# Patient Record
Sex: Female | Born: 1960 | Race: Black or African American | Hispanic: No | Marital: Single | Smoking: Never smoker
Health system: Southern US, Community
[De-identification: ages and names within clinical notes are randomized; demographics above are authoritative.]

## PROBLEM LIST (undated history)

## (undated) DIAGNOSIS — I10 Essential (primary) hypertension: Secondary | ICD-10-CM

## (undated) DIAGNOSIS — T8859XA Other complications of anesthesia, initial encounter: Secondary | ICD-10-CM

## (undated) DIAGNOSIS — K219 Gastro-esophageal reflux disease without esophagitis: Secondary | ICD-10-CM

## (undated) DIAGNOSIS — J392 Other diseases of pharynx: Secondary | ICD-10-CM

## (undated) DIAGNOSIS — M1990 Unspecified osteoarthritis, unspecified site: Secondary | ICD-10-CM

## (undated) DIAGNOSIS — R040 Epistaxis: Secondary | ICD-10-CM

## (undated) DIAGNOSIS — E119 Type 2 diabetes mellitus without complications: Secondary | ICD-10-CM

## (undated) DIAGNOSIS — J309 Allergic rhinitis, unspecified: Secondary | ICD-10-CM

## (undated) DIAGNOSIS — M109 Gout, unspecified: Secondary | ICD-10-CM

## (undated) DIAGNOSIS — Z9889 Other specified postprocedural states: Secondary | ICD-10-CM

## (undated) DIAGNOSIS — T4145XA Adverse effect of unspecified anesthetic, initial encounter: Secondary | ICD-10-CM

## (undated) DIAGNOSIS — R112 Nausea with vomiting, unspecified: Secondary | ICD-10-CM

## (undated) HISTORY — DX: Essential (primary) hypertension: I10

## (undated) HISTORY — DX: Allergic rhinitis, unspecified: J30.9

## (undated) HISTORY — PX: ABDOMINAL HYSTERECTOMY: SHX81

## (undated) HISTORY — DX: Type 2 diabetes mellitus without complications: E11.9

## (undated) HISTORY — DX: Gout, unspecified: M10.9

## (undated) HISTORY — PX: SINOSCOPY: SHX187

## (undated) HISTORY — PX: CERVICAL BIOPSY: SHX590

---

## 2004-04-30 ENCOUNTER — Other Ambulatory Visit: Admission: RE | Admit: 2004-04-30 | Discharge: 2004-04-30 | Payer: Self-pay | Admitting: Obstetrics and Gynecology

## 2004-05-03 ENCOUNTER — Ambulatory Visit (HOSPITAL_COMMUNITY): Admission: RE | Admit: 2004-05-03 | Discharge: 2004-05-03 | Payer: Self-pay | Admitting: Obstetrics and Gynecology

## 2004-06-05 ENCOUNTER — Encounter (INDEPENDENT_AMBULATORY_CARE_PROVIDER_SITE_OTHER): Payer: Self-pay | Admitting: Specialist

## 2004-06-05 ENCOUNTER — Ambulatory Visit (HOSPITAL_COMMUNITY): Admission: RE | Admit: 2004-06-05 | Discharge: 2004-06-05 | Payer: Self-pay | Admitting: Obstetrics and Gynecology

## 2004-06-29 ENCOUNTER — Inpatient Hospital Stay (HOSPITAL_COMMUNITY): Admission: AD | Admit: 2004-06-29 | Discharge: 2004-06-29 | Payer: Self-pay | Admitting: Obstetrics and Gynecology

## 2004-07-08 ENCOUNTER — Encounter (INDEPENDENT_AMBULATORY_CARE_PROVIDER_SITE_OTHER): Payer: Self-pay | Admitting: Specialist

## 2004-07-08 ENCOUNTER — Inpatient Hospital Stay (HOSPITAL_COMMUNITY): Admission: RE | Admit: 2004-07-08 | Discharge: 2004-07-10 | Payer: Self-pay | Admitting: Obstetrics and Gynecology

## 2005-05-12 ENCOUNTER — Other Ambulatory Visit: Admission: RE | Admit: 2005-05-12 | Discharge: 2005-05-12 | Payer: Self-pay | Admitting: Obstetrics and Gynecology

## 2006-02-02 ENCOUNTER — Ambulatory Visit (HOSPITAL_COMMUNITY): Admission: RE | Admit: 2006-02-02 | Discharge: 2006-02-02 | Payer: Self-pay | Admitting: Obstetrics and Gynecology

## 2007-02-15 ENCOUNTER — Ambulatory Visit (HOSPITAL_COMMUNITY): Admission: RE | Admit: 2007-02-15 | Discharge: 2007-02-15 | Payer: Self-pay | Admitting: Obstetrics and Gynecology

## 2008-02-25 ENCOUNTER — Ambulatory Visit (HOSPITAL_COMMUNITY): Admission: RE | Admit: 2008-02-25 | Discharge: 2008-02-25 | Payer: Self-pay | Admitting: Obstetrics and Gynecology

## 2009-03-02 ENCOUNTER — Ambulatory Visit (HOSPITAL_COMMUNITY): Admission: RE | Admit: 2009-03-02 | Discharge: 2009-03-02 | Payer: Self-pay | Admitting: Obstetrics and Gynecology

## 2010-03-08 ENCOUNTER — Ambulatory Visit (HOSPITAL_COMMUNITY)
Admission: RE | Admit: 2010-03-08 | Discharge: 2010-03-08 | Payer: Self-pay | Source: Home / Self Care | Attending: Obstetrics and Gynecology | Admitting: Obstetrics and Gynecology

## 2010-04-14 ENCOUNTER — Encounter: Payer: Self-pay | Admitting: Obstetrics and Gynecology

## 2010-08-09 NOTE — H&P (Signed)
Brittany Huerta, Brittany Huerta                ACCOUNT NO.:  [id number]   MEDICAL RECORD NO.:  [id number]          PATIENT TYPE:  INP   LOCATION:  NA                            FACILITY:  [facility]   PHYSICIAN:  Naima A. Dillard, M.D. DATE OF BIRTH:  March 22, 1961   DATE OF ADMISSION:  DATE OF DISCHARGE:                                HISTORY & PHYSICAL   CHIEF COMPLAINT:  Symptomatic fibroids.   HISTORY AND PHYSICAL:  The patient is a 50 year old African American female,  gravida 0, who presented to me in February of 2006 complaining of irregular  cycles.  The patient stated that this has been a problem since January of  2006.  Since then, her periods, she has always had an up and down spotting.  Sometimes, it comes pretty uncomfortable.  She has very painful cycles with  cramping.  The patient did not have any vaginal discharge or odor.  She uses  aspirin.  She denied having any dysuria, frequency, urgency or hematuria.  Denies any history of kidney stones, constipation, diarrhea, rectal  bleeding, nausea or vomiting.  The patient did have an ultrasound which was  significant for a 13 x 6.5 size uterus with one fibroid measuring 5.3 cm and  the other measuring 3.8 cm, normal ovaries.  Ultrasound was also significant  for questionable adenomyosis.  The patient has tried birth control pills in  the past without relief.  When I met her, she was on progesterone cream and  Estroven.  The patient says she has tried Provera in the past, also without  relief.  The patient was eventually placed on birth control pills and then  has heavy bleeding episodes this month in the emergency room.  She was then  placed on Provera and the patient has stated that she wanted definitive  treatment.   PAST GYNECOLOGIC HISTORY:  Menarche at age 66, occurring every month,  lasting for three to five days.  The patient denies any history of any  sexually transmitted diseases.  She did have an abnormal Pap smear with an  adequate colpo biopsy, which was found to be positive for cervicitis.   PAST MEDICAL HISTORY:  As above.   PAST SURGICAL HISTORY:  Sinus surgery and the LEEP.   PASSED OBSTETRIC HISTORY:  Unremarkable.   FAMILY HISTORY:  Mother significant for breast cancer and is postmenopausal.  Positive for diabetes in father.  Positive for hypertension.   The patient denies any alcohol, drug or tobacco use.   ALLERGIES:  Penicillin.   PHYSICAL EXAMINATION:  VITAL SIGNS:  The patient's weight is 264 pounds,  blood pressure is 160/100.  HEENT:  Pupils are equal.  Hearing is normal.  Throat is clear.  NECK:  Thyroid is not enlarged.  HEART:  A regular rate and rhythm.  LUNGS:  Clear to auscultation bilaterally.  BREASTS:  No masses or discharge, skin changes or nipple retraction.  BACK:  No CVA tenderness.  ABDOMEN:  Nontender without any masses or organomegaly.  It is obese, soft  and nontender.  EXTREMITIES:  No cyanosis, clubbing or edema bilaterally.  NEUROLOGICAL:  Exam is within normal limits.  VAGINAL:  Full vaginal exam is within normal limits.  Cervix was nontender  without any lesions.  A very difficult exam secondary to atrophic changes of  the vagina and patient discomfort.  Uterus was difficult to assess secondary  to body habitus.  Adnexa have no masses and nontender.  RECTOVAGINAL:  Exam is within normal limits.   PERTINENT LABORATORY DATA:  Pregnancy test was negative.  Her hemoglobin is  12.5.  Her endometrial biopsy was benign for secretory endometrium.  Her  ultrasound is as above.  Her mammogram was found to be within normal limits  in February of 2006.  Her TSH, FSH, and prolactin were all within normal  limits.  Pap smear was significant for low grade intrpepithelial  lesion.  The patient underwent colposcopy and the findings are above.   ASSESSMENT:  Dysmenorrhea, symptomatic fibroids.   PLAN:  The plan was reviewed with the patient in detail.  The options for   treatment which include observation, birth control pills or hormonal  therapy, uterine artery embolization and hysterectomy, LABH or a TAH.  The  patient says she desires a hysterectomy and thus a bilateral salpingo-  oophorectomy.  The patient understands the risks described such as bleeding,  infection, damage to internal organs such as bowel, bladder and major  vessels.  ____________, which was very difficult and I discussed with the  patient that a vaginal hysterectomy was probably a better way to do a  hysterectomy.  I  r offered her referral to [facility] if she wanted a  total laparoscopic hysterectomy.  The patient stated that she wanted an  abdominal hysterectomy with a bilateral salpingo-oophorectomy.  She  understands that the bilateral salpingo-oophorectomy will place her in  menopause and that she may want hormonal treatment for symptoms.      NAD/MEDQ  D:  07/07/2004  T:  07/07/2004  Job:  [id number]

## 2010-08-09 NOTE — Op Note (Signed)
Brittany Huerta, Brittany Huerta                ACCOUNT NO.:  [id number]   MEDICAL RECORD NO.:  [id number]          PATIENT TYPE:  AMB   LOCATION:  SDC                           FACILITY:  [facility]   PHYSICIAN:  Naima A. Dillard, M.D. DATE OF BIRTH:  [birth year]   DATE OF PROCEDURE:  06/05/2004  DATE OF DISCHARGE:                                 OPERATIVE REPORT   PREOPERATIVE DIAGNOSES:  1.  Abnormal Papanicolaou smear with inadequate colposcopy.  2.  Uterine fibroids.  3.  Metrorrhagia.   POSTOPERATIVE DIAGNOSES:  1.  Abnormal Papanicolaou smear with inadequate colposcopy.  2.  Uterine fibroids.  3.  Metrorrhagia.   PROCEDURE:  Loop electrosurgical excision procedure with endocervical  curettage.   SURGEON:  Naima A. Normand Sloop, M.D.  There was no assistant.   ANESTHESIA:  Started with MAC, and she got 20 mL of 1% Xylocaine, but the  patient would not tolerate the MAC anesthesia so she had general anesthesia  with laryngeal mask airway.   SURGICAL SPECIMENS:  Cervical LEEP specimen and endocervical curettage.   ESTIMATED BLOOD LOSS:  Minimal.   COMPLICATIONS:  None.   Patient to PACU in good condition.   PROCEDURE IN DETAIL:  The patient was taken to the operating room, where she  was placed in dorsal lithotomy position and prepped and draped in a normal  sterile fashion.  A four-way speculum was placed.  First a bivalve speculum  was placed into the vagina, but we were unable to get adequate visualization  of the cervix so a four-way speculum was placed into the vagina and adequate  visualization of the cervix was obtained.  Lugol's solution was placed on  the cervix.  Before that, the cervix was infiltrated with 20 mL of 1%  Xylocaine paracervical block.  A  small loop was then used to remove the cervical specimen, ECC was done and  the cervical bed was made hemostatic using Bovie cautery and Monsel's.  Sponge, lap and needle counts were correct x2.  The patient returned to the  recovery room in stable condition.      NAD/MEDQ  D:  06/05/2004  T:  06/05/2004  Job:  [id number]

## 2010-08-09 NOTE — Op Note (Signed)
Brittany Huerta, Brittany Huerta                ACCOUNT NO.:  [id number]   MEDICAL RECORD NO.:  [id number]          PATIENT TYPE:  INP   LOCATION:  9399                          FACILITY:  [facility]   PHYSICIAN:  Naima A. Dillard, M.D. DATE OF BIRTH:  1960-09-15   DATE OF PROCEDURE:  DATE OF DISCHARGE:                                 OPERATIVE REPORT   PREOPERATIVE DIAGNOSES:  1.  Menorrhagia.  2.  Dysmenorrhea.  3.  Symptomatic fibroids.   POSTOPERATIVE DIAGNOSES:  1.  Menorrhagia.  2.  Dysmenorrhea.  3.  Symptomatic fibroids.   PROCEDURE:  Total abdominal hysterectomy, bilateral salpingo-oophorectomy.   SURGEON:  Naima A. Normand Sloop, M.D.   ASSISTANT:  Osborn Coho, M.D.   ANESTHESIA:  Epidural and spinal combination.   SPECIMENS:  Uterus, cervix, ovaries, and fallopian tubes.   ESTIMATED BLOOD LOSS:  350 mL.   URINE OUTPUT:  325 mL.   FLUIDS:  2500 mL.   COMPLICATIONS:  None.   DISPOSITION:  The patient went to PACU in stable condition.   PROCEDURE IN DETAIL:  The patient was taken to the operating room where she  was given epidural, spinal anesthesia, placed in the dorsal supine position  and prepped and draped in a normal sterile fashion.  A Foley catheter was  placed without difficulty.  A Pfannenstiel skin incision was made with the  scalpel and then carried down to the fascia.  The fascia was incised in the  midline, extended bilaterally.  Kochers x2 were placed in the superior  aspect of the fascia which was dissected off the rectus muscles both sharply  and bluntly.  The inferior aspect of the fascia was dissected in a similar  fashion.  The rectus muscles were separated in the midline.  The peritoneum  was identified, tented up, and entered sharply and extended superiorly and  inferiorly with good visualization of bowel and bladder.  The bowel was  packed away with moist laparotomy sponges.  O'Connor-O'Sullivan was placed  first but this did not give good visualization so  this was removed and the  Balfour retractor was placed.  Once the Balfour retractor was in place it  was noted that a screw was missing so an x-ray was done at the end of the  procedure which was negative.   With two Kelly clamps placed on each cornua, the patient's right round  ligament was clamped, cauterized, and cut, noted to be hemostatic.  The  vesicouterine peritoneum was entered sharply and extended to the midline.  The bladder was sharply and bluntly dissected away from the uterus and  cervix.  Attention was then turned to the patient's left round ligament  which was clamped, cauterized, and cut.  The vesicouterine peritoneum on  that side was then entered sharply and extended to the midline.  The bladder  was then further disected away with moist laparotomy sponges.  The posterior  side of the broad ligament was then entered and the patient's left ureter  was unable to be found.  We looked on the peritoneal side, still unable to  be found.  We were able to isolated IP though we realized that the ureter  was not near that point.  We did, instead, take the uterine ovarian  ligaments, doubly clamped, cut, and ligated.  Hemostasis was assured.  The  same thing was done on the right side because the ureter could not be seen  because of a fibroid in the way so we just did the uterine ovarian  ligaments.  We clamped, cut, and ligated.  Both uterine arteries were  clamped and cut.  Fundectomy was then performed.  At this point, both  uterosacrals were clamped, cut, and ligated, and sutured to the vaginal  mucosa.  The cervix was then removed.  The vaginal mucosa was then made  hemostatic with figure-of-eight stitches.  Hemostasis was assured.  The  patient's left fallopian tube and ovary was then also looked at.  At this  time, the anesthesiologist let me know that there was a little bit of a  blood tinge in the Foley catheter.  The patient's left ureter again was  thought  to be seen.   We were unable to definitely locate it, so the IP was  isolated and we did not see the ureter at that point.  The IP was then  doubly clamped, cut, and suture ligated.  Hemostasis was assured.   Attention was then turned to the right ovary and to where the ureter was  seen and noted to peristalse.  The infundibulopelvic ligaments were doubly  clamped cut, and hemostasis was assured.  Indigo carmine was given and noted  to be in the tube, was hemostatic.  No indigo carmine was noted to come out  on the Foley.  Irrigation was done and there was some bleeding along the  left peritoneal space where we did dissection for the ureter.  Gelfoam was  placed in this cavity.  Hemostasis was assured.  Again irrigation was done  and hemostasis was assured.  All instruments were removed from the abdomen.  The peritoneum was closed with 2-0 chromic.  The muscle was irrigated and  noted to be hemostatic.  The fascia was closed with 0 Vicryl in a running  locked fashion to the midline on both sides.  The subcutaneous tissue was  made hemostatic with Bovie cautery and reapproximated using 2-0 plain.  The  skin was closed with 2-0 Monocryl in a subcuticular fashion.  Sponge, lap,  and needle counts were correct x2.  The patient went to recovery room in  stable condition.      NAD/MEDQ  D:  07/08/2004  T:  07/08/2004  Job:  [id number]

## 2010-08-09 NOTE — Discharge Summary (Signed)
Brittany Huerta, Brittany Huerta                ACCOUNT NO.:  [id number]   MEDICAL RECORD NO.:  [id number]          PATIENT TYPE:  INP   LOCATION:  9320                          FACILITY:  [facility]   PHYSICIAN:  Naima A. Dillard, M.D. DATE OF BIRTH:  07-08-1960   DATE OF ADMISSION:  07/08/2004  DATE OF DISCHARGE:  07/10/2004                                 DISCHARGE SUMMARY   ADMISSION DIAGNOSES:  1.  Menorrhagia.  2.  Dysmenorrhea.  3.  Symptomatic fibroids.   DISCHARGE DIAGNOSIS:  Status post abdominal hysterectomy and bilateral  salpingo-oophorectomy for menorrhagia, dysmenorrhea, and symptomatic  fibroids.   DISCHARGE MEDICATIONS:  Colace, Motrin, Percocet, Lipitor, and Accuretic.  Discharge Diagnosis  Stable   The patient is to not do any driving for 2 weeks.  Verbal and written  discharge instructions were given.  Diet has no restriction and the patient  is to follow up with Dr. Normand Sloop on Aug 20, 2004 at 10 a.m.   The patient came in on July 08, 2004 for a total abdominal hysterectomy and  bilateral salpingo-oophorectomy secondary to symptomatic fibroids.  On  postop day #1, she was doing well.  Blood pressures were slightly elevated  at 150s-180s/84-99.  Heart was regular in rate and rhythm.  Abdomen was soft  and nontender with decreased bowel sounds.  Preop hemoglobin was 11.3.  Postop hemoglobin was 10.0.  Postop labs were 436.  BUN and creatinine were  stable at 9 and 0.8.  On postoperative day #2, the patient states she is  still feeling fine.  She did have a low-grade temperature in the early  morning at 3 a.m. of 100.6 and then defervesced back down to a temperature  maximum of 99.  Heart was regular in rate and rhythm.  Lungs were clear to  auscultation bilaterally without any rales or rhonchi.  Extremities had no  cyanosis, clubbing, or edema.  The patient was stable and stated she wanted  to go home.  We then let her stay all morning, took more temperatures, and  found that  she was afebrile with good bowel sounds, tolerating p.o. with  flatus.  The patient seemed to have benefited from her hospital stay.  She  is postop day #2 from a hysterectomy and doing well, eating regular food,  ambulating without difficulty, and pain is well controlled.  She did have  elevated fasting blood sugar this morning, for which she will follow up with  her primary care doctor.  Her blood pressures are better controlled with her  blood pressure medication, her antihypertensive, and she will follow up with  her primary care doctor for that also.  Discharge instructions were given  both verbally and written.  The patient is to do no driving for 2 weeks and  remain on pelvic rest.      NAD/MEDQ  D:  07/10/2004  T:  07/10/2004  Job:  [id number]

## 2010-08-09 NOTE — H&P (Signed)
Brittany Huerta, Brittany Huerta NO.:  [id number]   MEDICAL RECORD NO.:  [id number]           PATIENT TYPE:   LOCATION:                                 FACILITY:   PHYSICIAN:  Naima A. Dillard, M.D. DATE OF BIRTH:  [birth year]   DATE OF ADMISSION:  DATE OF DISCHARGE:                                HISTORY & PHYSICAL   CHIEF COMPLAINT:  Inadequate colposcopy with abnormal Pap smears and  metrorrhagia with uterine fibroids.   HISTORY AND PHYSICAL:  The patient is a 50 year old African-American female,  gravida 0, who presented to me on April 30, 2004, complaining of irregular  periods starting in January of 2006.  The patient has been on Estroven with  progesterone cream since May of 2005 for menopausal symptoms, which did give  her some relief, but her main complaint is that she has spotting and  irregular bleeding since January of 2006.  The patient reports that her  cycle started March 24, 2004 and ended March 31, 2004, and then she had  some spotting from April 01, 2004 to April 05, 2004.  On April 06, 2004, she had severe cramping with some bleeding, and it lasted until  April 11, 2004.  On April 19, 2004, she had severe cramping which lasted  until April 26, 2004.  The patient is not on any contraception.  She does  have fibroids.  She is on Estroven and progesterone cream.  She has not had  any new medications.  She does have some menopausal symptoms.  Denies any  vaginal discharge.  Does have occasional abdominal pain, and basically  dysmenorrhea with bleeding, and does have some increased stress.  The  patient had an ultrasound which demonstrated the uterus to be about 13 x 5.4  x 6.5 cm, with a large pedunculated fibroid measuring 5.3 cm, and then a  lower uterine segment fibroid measuring 3.8 cm.  The left ovary was normal  with a small amount of free fluid noted.  Also, there were some areas which  looked as if they could be significant for  adenomyosis.  The patient had an  endometrial biopsy, which was found to be benign secretory endometrium.  No  hyperplasia or malignancy.  Her Pap smear was done and found to be ASCUS or  atypical squamous cells of undetermined significance, but suspicious for,  but not diagnostic of, a low-grade squamous intraepithelial lesion.  The  patient had a colposcopy, which was found to be inadequate, at that point,  and they decided to do LEEP.  Her HPV was negative.   MEDICATIONS:  Estroven. and progesterone cream (actually which was  discontinued on April 30, 2004).   ALLERGIES:  PENICILLIN.   GYNECOLOGIC HISTORY:  She started menarche at age 35.  They used to come  every month or so.  They have been abnormal and last for 3-5 days.  The  patient denies any history of sexually transmitted diseases.  Her last Pap  smear was as above.   PAST MEDICAL HISTORY:  Basically unremarkable.   FAMILY HISTORY:  Significant  for diabetes and high blood pressure.   PAST SURGICAL HISTORY:  Significant for a tooth root extraction from the  sinus cavity.   REVIEW OF SYSTEMS:  CARDIOVASCULAR:  Unremarkable.  GI:  Unremarkable.  GENITOURINARY:  As above.  PSYCHIATRIC:  Unremarkable.  ENDOCRINE:  Unremarkable.   PHYSICAL EXAMINATION:  VITAL SIGNS:  The patient weighs 262 pounds.  Her  blood pressure is 130/90.  HEENT:  Pupils are equal.  Hearing is normal.  Throat is clear.  NECK:  Thyroid is not enlarged.  HEART:  Regular rate and rhythm.  LUNGS:  Clear to auscultation bilaterally.  BREASTS:  No masses, discharge, skin changes, or nipple retraction.  BACK:  No CVA tenderness.  ABDOMEN:  Nontender without any masses or organomegaly.  EXTREMITIES:  No clubbing, cyanosis, or edema.  NEUROLOGIC:  Within normal limits.  PELVIC:  Vaginal exam is within normal limits.  Cervix is nontender without  any lesions.  Uterus was difficult to tell the size secondary to body  habitus.  Adnexa had no masses.   RECTOVAGINAL:  Within normal limits.   LABORATORY DATA:  Hemoglobin was found to be 12.5.   ASSESSMENT:  Metrorrhagia and abnormal Pap smear with inadequate colposcopy.  I was unable to see a transition zone.   PLAN:  LEEP with ECC.  The patient desires hysterectomy for her fibroids  after all treatments were reviewed, and we will plan to do the hysterectomy  after the findings of the LEEP.  She understands the risks are, but not  limited to, bleeding, infection, damage to internal organs such as bowel,  bladder, and major blood vessels.      NAD/MEDQ  D:  06/04/2004  T:  06/04/2004  Job:  [id number]

## 2011-03-31 ENCOUNTER — Other Ambulatory Visit (HOSPITAL_COMMUNITY): Payer: Self-pay | Admitting: Family Medicine

## 2011-03-31 DIAGNOSIS — Z1231 Encounter for screening mammogram for malignant neoplasm of breast: Secondary | ICD-10-CM

## 2011-04-25 ENCOUNTER — Ambulatory Visit (HOSPITAL_COMMUNITY)
Admission: RE | Admit: 2011-04-25 | Discharge: 2011-04-25 | Disposition: A | Discharge status: Home / Self Care | Payer: Federal, State, Local not specified - PPO | Source: Ambulatory Visit | Attending: Family Medicine | Admitting: Family Medicine

## 2011-04-25 DIAGNOSIS — Z1231 Encounter for screening mammogram for malignant neoplasm of breast: Secondary | ICD-10-CM | POA: Insufficient documentation

## 2012-06-28 ENCOUNTER — Other Ambulatory Visit (HOSPITAL_COMMUNITY): Payer: Self-pay | Admitting: Physician Assistant

## 2012-06-28 DIAGNOSIS — Z1231 Encounter for screening mammogram for malignant neoplasm of breast: Secondary | ICD-10-CM

## 2012-07-30 ENCOUNTER — Ambulatory Visit (HOSPITAL_COMMUNITY): Payer: Federal, State, Local not specified - PPO

## 2012-09-03 ENCOUNTER — Ambulatory Visit (HOSPITAL_COMMUNITY)
Admission: RE | Admit: 2012-09-03 | Discharge: 2012-09-03 | Disposition: A | Discharge status: Home / Self Care | Payer: Federal, State, Local not specified - PPO | Source: Ambulatory Visit | Attending: Physician Assistant | Admitting: Physician Assistant

## 2012-09-03 DIAGNOSIS — Z1231 Encounter for screening mammogram for malignant neoplasm of breast: Secondary | ICD-10-CM | POA: Insufficient documentation

## 2013-10-03 ENCOUNTER — Other Ambulatory Visit (HOSPITAL_COMMUNITY): Payer: Self-pay | Admitting: Physician Assistant

## 2013-10-03 DIAGNOSIS — Z1231 Encounter for screening mammogram for malignant neoplasm of breast: Secondary | ICD-10-CM

## 2013-10-07 ENCOUNTER — Ambulatory Visit (HOSPITAL_COMMUNITY)
Admission: RE | Admit: 2013-10-07 | Discharge: 2013-10-07 | Disposition: A | Discharge status: Home / Self Care | Payer: Federal, State, Local not specified - PPO | Source: Ambulatory Visit | Attending: Physician Assistant | Admitting: Physician Assistant

## 2013-10-07 DIAGNOSIS — Z1231 Encounter for screening mammogram for malignant neoplasm of breast: Secondary | ICD-10-CM | POA: Insufficient documentation

## 2013-12-07 ENCOUNTER — Other Ambulatory Visit: Payer: Self-pay | Admitting: Physician Assistant

## 2013-12-07 ENCOUNTER — Ambulatory Visit
Admission: RE | Admit: 2013-12-07 | Discharge: 2013-12-07 | Disposition: A | Discharge status: Home / Self Care | Payer: Federal, State, Local not specified - PPO | Source: Ambulatory Visit | Attending: Physician Assistant | Admitting: Physician Assistant

## 2013-12-07 DIAGNOSIS — M25532 Pain in left wrist: Secondary | ICD-10-CM

## 2014-09-11 ENCOUNTER — Other Ambulatory Visit (HOSPITAL_COMMUNITY): Payer: Self-pay | Admitting: Physician Assistant

## 2014-09-11 DIAGNOSIS — Z1231 Encounter for screening mammogram for malignant neoplasm of breast: Secondary | ICD-10-CM

## 2014-10-13 ENCOUNTER — Ambulatory Visit (HOSPITAL_COMMUNITY)
Admission: RE | Admit: 2014-10-13 | Discharge: 2014-10-13 | Disposition: A | Discharge status: Home / Self Care | Payer: Federal, State, Local not specified - PPO | Source: Ambulatory Visit | Attending: Physician Assistant | Admitting: Physician Assistant

## 2014-10-13 DIAGNOSIS — Z1231 Encounter for screening mammogram for malignant neoplasm of breast: Secondary | ICD-10-CM | POA: Diagnosis not present

## 2015-08-31 DIAGNOSIS — E7800 Pure hypercholesterolemia, unspecified: Secondary | ICD-10-CM | POA: Diagnosis not present

## 2015-08-31 DIAGNOSIS — E119 Type 2 diabetes mellitus without complications: Secondary | ICD-10-CM | POA: Diagnosis not present

## 2015-08-31 DIAGNOSIS — Z0000 Encounter for general adult medical examination without abnormal findings: Secondary | ICD-10-CM | POA: Diagnosis not present

## 2015-08-31 DIAGNOSIS — M109 Gout, unspecified: Secondary | ICD-10-CM | POA: Diagnosis not present

## 2015-08-31 DIAGNOSIS — I10 Essential (primary) hypertension: Secondary | ICD-10-CM | POA: Diagnosis not present

## 2015-08-31 DIAGNOSIS — Z23 Encounter for immunization: Secondary | ICD-10-CM | POA: Diagnosis not present

## 2015-09-12 DIAGNOSIS — M25562 Pain in left knee: Secondary | ICD-10-CM | POA: Diagnosis not present

## 2015-09-12 DIAGNOSIS — M25561 Pain in right knee: Secondary | ICD-10-CM | POA: Diagnosis not present

## 2015-10-02 DIAGNOSIS — Z13820 Encounter for screening for osteoporosis: Secondary | ICD-10-CM | POA: Diagnosis not present

## 2015-10-02 DIAGNOSIS — Z780 Asymptomatic menopausal state: Secondary | ICD-10-CM | POA: Diagnosis not present

## 2015-10-05 DIAGNOSIS — M1710 Unilateral primary osteoarthritis, unspecified knee: Secondary | ICD-10-CM | POA: Diagnosis not present

## 2015-10-05 DIAGNOSIS — I10 Essential (primary) hypertension: Secondary | ICD-10-CM | POA: Diagnosis not present

## 2015-10-12 DIAGNOSIS — M170 Bilateral primary osteoarthritis of knee: Secondary | ICD-10-CM | POA: Diagnosis not present

## 2015-10-19 DIAGNOSIS — M1712 Unilateral primary osteoarthritis, left knee: Secondary | ICD-10-CM | POA: Diagnosis not present

## 2015-10-19 DIAGNOSIS — M1711 Unilateral primary osteoarthritis, right knee: Secondary | ICD-10-CM | POA: Diagnosis not present

## 2015-10-22 ENCOUNTER — Other Ambulatory Visit: Payer: Self-pay | Admitting: Physician Assistant

## 2015-10-22 DIAGNOSIS — Z1231 Encounter for screening mammogram for malignant neoplasm of breast: Secondary | ICD-10-CM

## 2015-10-26 DIAGNOSIS — M1712 Unilateral primary osteoarthritis, left knee: Secondary | ICD-10-CM | POA: Diagnosis not present

## 2015-10-26 DIAGNOSIS — M1711 Unilateral primary osteoarthritis, right knee: Secondary | ICD-10-CM | POA: Diagnosis not present

## 2015-11-02 DIAGNOSIS — M1712 Unilateral primary osteoarthritis, left knee: Secondary | ICD-10-CM | POA: Diagnosis not present

## 2015-11-02 DIAGNOSIS — M1711 Unilateral primary osteoarthritis, right knee: Secondary | ICD-10-CM | POA: Diagnosis not present

## 2015-11-09 DIAGNOSIS — M1711 Unilateral primary osteoarthritis, right knee: Secondary | ICD-10-CM | POA: Diagnosis not present

## 2015-11-09 DIAGNOSIS — M1712 Unilateral primary osteoarthritis, left knee: Secondary | ICD-10-CM | POA: Diagnosis not present

## 2015-11-16 ENCOUNTER — Ambulatory Visit
Admission: RE | Admit: 2015-11-16 | Discharge: 2015-11-16 | Disposition: A | Discharge status: Home / Self Care | Payer: Federal, State, Local not specified - PPO | Source: Ambulatory Visit | Attending: Physician Assistant | Admitting: Physician Assistant

## 2015-11-16 DIAGNOSIS — Z1231 Encounter for screening mammogram for malignant neoplasm of breast: Secondary | ICD-10-CM | POA: Diagnosis not present

## 2015-12-14 DIAGNOSIS — M1711 Unilateral primary osteoarthritis, right knee: Secondary | ICD-10-CM | POA: Diagnosis not present

## 2015-12-21 DIAGNOSIS — E119 Type 2 diabetes mellitus without complications: Secondary | ICD-10-CM | POA: Diagnosis not present

## 2015-12-21 DIAGNOSIS — H40013 Open angle with borderline findings, low risk, bilateral: Secondary | ICD-10-CM | POA: Diagnosis not present

## 2015-12-21 DIAGNOSIS — H40053 Ocular hypertension, bilateral: Secondary | ICD-10-CM | POA: Diagnosis not present

## 2015-12-21 DIAGNOSIS — H353111 Nonexudative age-related macular degeneration, right eye, early dry stage: Secondary | ICD-10-CM | POA: Diagnosis not present

## 2015-12-21 DIAGNOSIS — H353121 Nonexudative age-related macular degeneration, left eye, early dry stage: Secondary | ICD-10-CM | POA: Diagnosis not present

## 2015-12-28 DIAGNOSIS — H40013 Open angle with borderline findings, low risk, bilateral: Secondary | ICD-10-CM | POA: Diagnosis not present

## 2016-03-03 DIAGNOSIS — E119 Type 2 diabetes mellitus without complications: Secondary | ICD-10-CM | POA: Diagnosis not present

## 2016-03-03 DIAGNOSIS — E7800 Pure hypercholesterolemia, unspecified: Secondary | ICD-10-CM | POA: Diagnosis not present

## 2016-03-03 DIAGNOSIS — M109 Gout, unspecified: Secondary | ICD-10-CM | POA: Diagnosis not present

## 2016-03-03 DIAGNOSIS — E1165 Type 2 diabetes mellitus with hyperglycemia: Secondary | ICD-10-CM | POA: Diagnosis not present

## 2016-03-03 DIAGNOSIS — Z1159 Encounter for screening for other viral diseases: Secondary | ICD-10-CM | POA: Diagnosis not present

## 2016-03-03 DIAGNOSIS — I10 Essential (primary) hypertension: Secondary | ICD-10-CM | POA: Diagnosis not present

## 2016-05-21 DIAGNOSIS — J329 Chronic sinusitis, unspecified: Secondary | ICD-10-CM | POA: Diagnosis not present

## 2016-10-10 DIAGNOSIS — H40013 Open angle with borderline findings, low risk, bilateral: Secondary | ICD-10-CM | POA: Diagnosis not present

## 2016-10-17 DIAGNOSIS — M1710 Unilateral primary osteoarthritis, unspecified knee: Secondary | ICD-10-CM | POA: Diagnosis not present

## 2016-10-17 DIAGNOSIS — I10 Essential (primary) hypertension: Secondary | ICD-10-CM | POA: Diagnosis not present

## 2016-10-17 DIAGNOSIS — E669 Obesity, unspecified: Secondary | ICD-10-CM | POA: Diagnosis not present

## 2016-10-27 ENCOUNTER — Other Ambulatory Visit: Payer: Self-pay | Admitting: Physician Assistant

## 2016-10-27 DIAGNOSIS — Z1231 Encounter for screening mammogram for malignant neoplasm of breast: Secondary | ICD-10-CM

## 2016-11-07 DIAGNOSIS — I10 Essential (primary) hypertension: Secondary | ICD-10-CM | POA: Diagnosis not present

## 2016-11-07 DIAGNOSIS — E1165 Type 2 diabetes mellitus with hyperglycemia: Secondary | ICD-10-CM | POA: Diagnosis not present

## 2016-11-07 DIAGNOSIS — Z0000 Encounter for general adult medical examination without abnormal findings: Secondary | ICD-10-CM | POA: Diagnosis not present

## 2016-11-07 DIAGNOSIS — M109 Gout, unspecified: Secondary | ICD-10-CM | POA: Diagnosis not present

## 2016-11-07 DIAGNOSIS — E7800 Pure hypercholesterolemia, unspecified: Secondary | ICD-10-CM | POA: Diagnosis not present

## 2016-11-07 DIAGNOSIS — Z7984 Long term (current) use of oral hypoglycemic drugs: Secondary | ICD-10-CM | POA: Diagnosis not present

## 2016-11-21 ENCOUNTER — Ambulatory Visit: Payer: Self-pay | Admitting: Pediatrics

## 2016-11-28 ENCOUNTER — Ambulatory Visit
Admission: RE | Admit: 2016-11-28 | Discharge: 2016-11-28 | Disposition: A | Discharge status: Home / Self Care | Payer: Federal, State, Local not specified - PPO | Source: Ambulatory Visit | Attending: Physician Assistant | Admitting: Physician Assistant

## 2016-11-28 DIAGNOSIS — Z1231 Encounter for screening mammogram for malignant neoplasm of breast: Secondary | ICD-10-CM | POA: Diagnosis not present

## 2016-12-12 ENCOUNTER — Ambulatory Visit (INDEPENDENT_AMBULATORY_CARE_PROVIDER_SITE_OTHER): Payer: Federal, State, Local not specified - PPO | Admitting: Family Medicine

## 2016-12-12 ENCOUNTER — Encounter: Payer: Self-pay | Admitting: Allergy and Immunology

## 2016-12-12 VITALS — BP 138/90 | HR 64 | Temp 98.2°F | Resp 18 | Ht 65.6 in | Wt 239.0 lb

## 2016-12-12 DIAGNOSIS — K219 Gastro-esophageal reflux disease without esophagitis: Secondary | ICD-10-CM | POA: Diagnosis not present

## 2016-12-12 DIAGNOSIS — J3089 Other allergic rhinitis: Secondary | ICD-10-CM

## 2016-12-12 DIAGNOSIS — B999 Unspecified infectious disease: Secondary | ICD-10-CM | POA: Diagnosis not present

## 2016-12-12 MED ORDER — OMEPRAZOLE 40 MG PO CPDR: DELAYED_RELEASE_CAPSULE | ORAL | 5 refills | Status: DC

## 2016-12-12 MED ORDER — AZELASTINE-FLUTICASONE 137-50 MCG/ACT NA SUSP: NASAL | 5 refills | Status: DC

## 2016-12-12 MED ORDER — RANITIDINE HCL 300 MG PO TABS: ORAL_TABLET | ORAL | 5 refills | Status: DC

## 2016-12-12 NOTE — Patient Instructions (Addendum)
1. LPRD (laryngopharyngeal reflux disease) - Begin omeprazole 40 mg every morning - Begin Ranitidine 300 mg at bedtime - Decrease the amount of caffeine and chocolate consumed  2. Other allergic rhinitis Allergy skin testing today showed: no allergies - Begin Dymista, one spray per nostril daily - Continue Xyzal daily - Continue Montelukast daily  3. Recurrent infectios - CBC - IgA, IgG, IgM, IgE  4. Follow up in 4 weeks

## 2016-12-12 NOTE — Progress Notes (Addendum)
NEW PATIENT  Date of Service/Encounter:  12/15/16  Referring provider: Milus Height,  PA-C   Assessment:   Other allergic rhinitis   LPRD (laryngopharyngeal reflux disease)  Recurrent infections     Plan/Recommendations:    1. LPRD (laryngopharyngeal reflux disease) - Begin omeprazole 40 mg every morning - Begin Ranitidine 300 mg at bedtime - Decrease the amount of caffeine and chocolate consumed  2. Other allergic rhinitis  - Begin Dymista, one spray per nostril daily - Continue Xyzal daily - Continue Montelukast daily  3. Recurrent infections of respiratory tract - CBC - IgA, IgG, IgM, IgE - I will follow up with lab results  4. Follow up in 4 weeks   Subjective:   Brittany Huerta is a 56 y.o. female presenting today for evaluation of  Chief Complaint  Patient presents with  . Nasal Congestion    History obtained from: chart review and patient interview.  Brittany Huerta was referred by  Milus Height, PA-C.     Brittany Huerta is a 56 y.o. female presenting for nasal congestion, drainage with cough and throat clearing which occurs year round without seasonal variation.Her symptoms began after having a dental procedure in where a tooth splinter protruded into the maxillary sinus during extraction in 1980. 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 Brittany Huerta reports allergy testing about 10 years ago revealed allergy to ragweed. She denies shortness of breath, wheezing, and limitation of activity. Her skin is free of rashes or lesions. She reports that raw cucumber and pineapple make her lip tingle and she avoids  these foods. Although she consumes caffeine and chocolate, she denies heartburn, water brash, and a sour taste in her mouth in the morning.   Past Medical History: There are no active problems to display for this patient.   Medication List:  Allergies as of 12/12/2016      Reactions   Ace Inhibitors Swelling   Diuretic [buchu-cornsilk-ch Grass-hydran] Swelling   Hctz [hydrochlorothiazide] Swelling   Lipitor [atorvastatin] Other (See Comments)   Leg cramps   Penicillins Itching, Nausea Only, Swelling   Angiotensin Receptor Blockers Swelling, Rash      Medication List      atenolol 50 MG tablet Commonly known as:  TENORMIN Take 50 mg by mouth 2 (two) times daily.   cloNIDine 0.3 MG tablet Commonly known as:  CATAPRES Take 0.3 mg by mouth 2 (two) times daily.   CONTOUR NEXT TEST test strip Generic drug:  glucose blood   diclofenac 50 MG EC tablet Commonly known as:  VOLTAREN Take 50 mg by mouth 2 (two) times daily as needed.   diphenhydrAMINE 25 mg capsule Commonly known as:  BENADRYL Take 25 mg by mouth daily.   doxazosin 8 MG tablet Commonly known as:  CARDURA Take 8 mg by mouth 2 (two) times daily.   FARXIGA 5 MG Tabs tablet Generic drug:  dapagliflozin propanediol Take 5 mg by mouth daily.   levocetirizine 5 MG tablet Commonly known as:  XYZAL Take 5 mg by mouth daily.   metFORMIN 1000 MG tablet Commonly known as:  GLUCOPHAGE Take 1,000 mg by mouth 2 (two) times daily.  montelukast 10 MG tablet Commonly known as:  SINGULAIR Take 10 mg by mouth daily.   MULTIVITAMIN PO Take 1 tablet by mouth daily.   torsemide 20 MG tablet Commonly known as:  DEMADEX Take 20 mg by mouth 2 (two) times daily.   ULORIC 40 MG tablet Generic drug:  febuxostat Take 40 mg by mouth daily.        Past Surgical History: Past Surgical History:  Procedure Laterality Date  . ABDOMINAL HYSTERECTOMY    . SINOSCOPY       Family History: Family History  Problem  Relation Age of Onset  . Breast cancer Mother   . Allergic rhinitis Mother   . Hypertension Mother   . Hypertension Father   . Diabetes Father   . Allergic rhinitis Brother      Social History: Brittany Huerta lives at home with her family. 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 There are no animals, roaches, chemicals, dust, or fumes located in the home.   Review of Systems: a 14-point review of systems is pertinent for what is mentioned in HPI.  Otherwise, all other systems were negative. Constitutional: negative other than that listed in the HPI Eyes: negative other than that listed in the HPI Ears, nose, mouth, throat, and face: negative other than that listed in the HPI Respiratory: negative other than that listed in the HPI Cardiovascular: negative other than that listed in the HPI Gastrointestinal: negative other than that listed in the HPI Genitourinary: negative other than that listed in the HPI Integument: negative other than that listed in the HPI Hematologic: negative other than that listed in the HPI Musculoskeletal: negative other than that listed in the HPI Neurological: negative other than that listed in the HPI Allergy/Immunologic: negative other than that listed in the HPI    Objective:   Blood pressure 138/90, pulse 64, temperature 98.2 F (36.8 C), temperature source Oral, resp. rate 18, height 5' 5.6" (1.666 m), weight 239 lb (108.4 kg). Body mass index is 39.05 kg/m.   Physical Exam:  General: Alert, interactive, in no acute distress. Eyes: No conjunctival injection present on the right and No conjunctival injection present on the left Ears: Right TM pearly gray with normal light reflex and Left TM pearly gray with normal light reflex.  Nose/Throat: External nose within normal limits and septum midline, turbinates  moderately edematous with clear discharge, post-pharynx mildly erythematous with cobblestoning in the posterior oropharynx. Tonsils unremarklable without exudates Neck: Supple without thyromegaly.  Adenopathy: no enlarged lymph nodes appreciated in the occipital, axillary, epitrochlear, inguinal, or popliteal regions. Lungs: Clear to auscultation without wheezing, rhonchi or rales. No increased work of breathing. CV: Normal S1/S2, no murmurs. Capillary refill <2 seconds.  Abdomen: Nondistended, nontender. No guarding or rebound tenderness. Bowel sounds present in all fields  Skin: Warm and dry, without lesions or rashes. Extremities:  No clubbing, cyanosis or edema. Neuro:   Grossly intact. No focal deficits appreciated. Responsive to questions.    Allergy Studies:   Indoor/Outdoor Percutaneous Adult Environmental Panel: negative with adequate controls.  Indoor/Outdoor Selected Intradermal Environmental Panel: negative with adequate controls.   Thank you for the opportunity to care for this patient.  Please do not hesitate to contact me with questions.    Thermon Leyland, FNP [facility]  I have provided oversight concerning Thermon Leyland' evaluation  and treatment of this patient's health issues addressed during tody's encounter. I agree with the assessment and therapeutic plan as outlined in the note.   Signed,   Jessica Priest, MD,  Allergy and Immunology,  [facility].

## 2016-12-16 LAB — CBC WITH DIFFERENTIAL/PLATELET
BASOS: 1 %
Basophils Absolute: 0 10*3/uL (ref 0.0–0.2)
EOS (ABSOLUTE): 0 10*3/uL (ref 0.0–0.4)
EOS: 1 %
HEMATOCRIT: 38.9 % (ref 34.0–46.6)
Hemoglobin: 13.1 g/dL (ref 11.1–15.9)
IMMATURE GRANS (ABS): 0 10*3/uL (ref 0.0–0.1)
IMMATURE GRANULOCYTES: 0 %
Lymphocytes Absolute: 2 10*3/uL (ref 0.7–3.1)
Lymphs: 46 %
MCH: 28.8 pg (ref 26.6–33.0)
MCHC: 33.7 g/dL (ref 31.5–35.7)
MCV: 86 fL (ref 79–97)
MONOCYTES: 7 %
MONOS ABS: 0.3 10*3/uL (ref 0.1–0.9)
NEUTROS PCT: 45 %
Neutrophils Absolute: 2 10*3/uL (ref 1.4–7.0)
Platelets: 363 10*3/uL (ref 150–379)
RBC: 4.55 x10E6/uL (ref 3.77–5.28)
RDW: 15.5 % — AB (ref 12.3–15.4)
WBC: 4.4 10*3/uL (ref 3.4–10.8)

## 2016-12-16 LAB — IMMUNOGLOBULINS A/E/G/M, SERUM
IGA/IMMUNOGLOBULIN A, SERUM: 196 mg/dL (ref 87–352)
IGE (IMMUNOGLOBULIN E), SERUM: 153 [IU]/mL — AB (ref 0–100)
IGM (IMMUNOGLOBULIN M), SRM: 80 mg/dL (ref 26–217)
IgG (Immunoglobin G), Serum: 2240 mg/dL — ABNORMAL HIGH (ref 700–1600)

## 2016-12-19 ENCOUNTER — Telehealth: Payer: Self-pay | Admitting: Family Medicine

## 2016-12-19 DIAGNOSIS — E119 Type 2 diabetes mellitus without complications: Secondary | ICD-10-CM | POA: Diagnosis not present

## 2016-12-19 DIAGNOSIS — H353111 Nonexudative age-related macular degeneration, right eye, early dry stage: Secondary | ICD-10-CM | POA: Diagnosis not present

## 2016-12-23 NOTE — Telephone Encounter (Signed)
Made in error. Already completed

## 2017-01-02 ENCOUNTER — Ambulatory Visit: Payer: Self-pay | Admitting: Allergy

## 2017-01-09 ENCOUNTER — Encounter: Payer: Self-pay | Admitting: Family Medicine

## 2017-01-09 ENCOUNTER — Ambulatory Visit (INDEPENDENT_AMBULATORY_CARE_PROVIDER_SITE_OTHER): Payer: Federal, State, Local not specified - PPO | Admitting: Family Medicine

## 2017-01-09 VITALS — BP 120/70 | HR 80 | Resp 18

## 2017-01-09 DIAGNOSIS — K219 Gastro-esophageal reflux disease without esophagitis: Secondary | ICD-10-CM | POA: Diagnosis not present

## 2017-01-09 DIAGNOSIS — R040 Epistaxis: Secondary | ICD-10-CM | POA: Diagnosis not present

## 2017-01-09 DIAGNOSIS — J3089 Other allergic rhinitis: Secondary | ICD-10-CM | POA: Diagnosis not present

## 2017-01-09 DIAGNOSIS — B999 Unspecified infectious disease: Secondary | ICD-10-CM | POA: Diagnosis not present

## 2017-01-09 MED ORDER — OMEPRAZOLE 20 MG PO CPDR: 20.0000 mg | DELAYED_RELEASE_CAPSULE | Freq: Every day | ORAL | 5 refills | Status: AC

## 2017-01-09 NOTE — Patient Instructions (Addendum)
1. LPRD (laryngopharyngeal reflux disease) - Decrease Omeprazole to 20 mg in the morning - Continue ranitidine 300 mg at bedtime - Continue to decrease caffeine and chocolate  2. Other allergic rhinitis - Continue Dymista as tolorated - Continue Xyzal daily - Continue Montelukast daily  3. Recurrent infections and elevated LFT - Recheck IgG, IgA, IgM - Check liver function tests  4. Epistaxis - Begin Bactroban ointment three times a day for 10 days - Referral to ENT to examine for visible vessel  Follow up in 8 weeks. We will call with lab results when they become available.

## 2017-01-09 NOTE — Progress Notes (Signed)
[address] [place] [address] Dept: [phone]  FAMILY NURSE PRACTITIONER FOLLOW UP NOTE  Patient ID: Brittany CommonHannah L Dreibelbis, female    DOB: [birth year]  Age: 56 y.o. MRN: [id number] Date of Office Visit: 01/09/2017  Assessment  Chief Complaint: Allergic Rhinitis ; Sinusitis; and Follow-up  HPI Brittany CommonHannah L Wenger presents for follow up of nasal congestion, drainage with cough and throat clearing. 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   Since that visit, she reports she is doing well and her rhinitis is well controlled. She has, however, experienced three right sided episodes of epistaxis in the previous month which she has treated with Afrin soaked cotton ball at the base of her nare until the bleeding stops. She has not started the Dymista as of now due to the nosebleeds.   She continues taking omeprazole 40 mg in the morning, which makes her stomach growl and churn, and 300 mg of ranitidine at bedtime. She has started to decrease her daily intake of caffiene and chocolate.   While hospitalized in April, Hanna had elevated liver enzymes, which apparently have not been re-evaluated since that time. She denies significant alcohol use, intervenous drug use, blood transfusion, and travel.    Drug Allergies:  Allergies  Allergen Reactions  . Ace Inhibitors Swelling  . Diuretic [Buchu-Cornsilk-Ch Grass-Hydran] Swelling  . Hctz [Hydrochlorothiazide] Swelling  . Lipitor [Atorvastatin] Other (See Comments)    Leg cramps   . Penicillins Itching, Nausea Only and Swelling  . Angiotensin Receptor Blockers Swelling and Rash    Physical Exam: BP 120/70   Pulse 80   Resp 18    Physical Exam  Constitutional: She is oriented to person, place, and time. She appears well-developed and well-nourished.  HENT:  Head: Normocephalic.  Right Ear: External ear normal.   Left Ear: External ear normal.  Bilateral nares erythematous and edematous. Thin clear secretions noted on exam. No epistaxis present on exam.  Eyes: Conjunctivae are normal.  Eyes normal with no redness or drainage on exam.   Neck: Normal range of motion. Neck supple.  Cardiovascular: Normal rate, regular rhythm and normal heart sounds.   Pulmonary/Chest: Effort normal and breath sounds normal.  Lungs clear to auscultation.   Abdominal: Soft. Bowel sounds are normal. She exhibits no distension and no mass.  Musculoskeletal: Normal range of motion.  Neurological: She is alert and oriented to person, place, and time.  Skin: Skin is warm and dry.  Psychiatric: Her behavior is normal.      Assessment and Plan: 1. LPRD (laryngopharyngeal reflux disease)   2. Other allergic rhinitis   3. Recurrent infections   4. Epistaxis     Meds ordered this encounter  Medications  . omeprazole (PRILOSEC) 20 MG capsule    Sig: Take 1 capsule (20 mg total) by mouth daily.    Dispense:  30 capsule    Refill:  5    Order Specific Question:   Supervising Provider    Answer:   KOZLOW, ERIC J [[id number]]    1. LPRD (laryngopharyngeal reflux disease) - Decrease Omeprazole to 20 mg in the morning - Continue ranitidine 300 mg at bedtime - Continue to decrease caffeine and chocolate  2. Other allergic rhinitis - Continue Dymista as tolorated - Continue Xyzal daily - Continue Montelukast daily  3. Recurrent infections and elevated LFT - Recheck IgG, IgA, IgM -  Check liver function tests  4. Epistaxis - Begin Bactroban ointment three times a day for 10 days - Referral to ENT to examine for visible vessel  Follow up in 8 weeks. We will call with lab results when they become available.   Return in about 8 weeks (around 03/06/2017), or if symptoms worsen or fail to improve.   Hanna's respiratory issues seems to be more controlled with the addition anti-inflammatory agents to her respiratory tract  and omeprazole and ranitidine. She is reporting stomach rumbling with omeprazole 40 mg so we will decrease this to 20 mg dose every morning and continue with ranitidine 300 every night. I am making a referral to ENT specialists for a report of three episodes of epistaxis in the last month. I will also re-check her Ig levels given her history of infections as well as liver function tests to ensure these have normalized.   Thank you for the opportunity to care for this patient.  Please do not hesitate to contact me with questions.  Thermon Leyland, FNP [facility]  I have provided oversight concerning Thermon Leyland' evaluation and treatment of this patient's health issues addressed during today's encounter. I agree with the assessment and therapeutic plan as outlined in the note.   Signed,   Jessica Priest, MD,  Allergy and Immunology,  [facility].

## 2017-01-10 LAB — HEPATIC FUNCTION PANEL
ALT: 22 IU/L (ref 0–32)
AST: 19 IU/L (ref 0–40)
Albumin: 4.3 g/dL (ref 3.5–5.5)
Alkaline Phosphatase: 93 IU/L (ref 39–117)
Bilirubin, Direct: 0.06 mg/dL (ref 0.00–0.40)
Total Protein: 8.3 g/dL (ref 6.0–8.5)

## 2017-01-10 LAB — IGG, IGA, IGM
IGA/IMMUNOGLOBULIN A, SERUM: 197 mg/dL (ref 87–352)
IgG (Immunoglobin G), Serum: 1823 mg/dL — ABNORMAL HIGH (ref 700–1600)
IgM (Immunoglobulin M), Srm: 86 mg/dL (ref 26–217)

## 2017-01-12 ENCOUNTER — Other Ambulatory Visit: Payer: Self-pay | Admitting: *Deleted

## 2017-01-12 MED ORDER — MUPIROCIN 2 % EX OINT: TOPICAL_OINTMENT | CUTANEOUS | 0 refills | Status: DC

## 2017-01-14 ENCOUNTER — Telehealth: Payer: Self-pay | Admitting: *Deleted

## 2017-01-14 NOTE — Telephone Encounter (Signed)
I had contacted patient regarding labs and she advised that she was suppose to have referral to ENT for epistaxis. I advised her I would look into same and call her back.  Per note by Thermon LeylandAnne Ambs, NP she had advised referral to ENT so I called [facility] and made referral to Dr Doran HeaterMarcellino on Fri., Nov. 2 at 1:00.  Called patient and given appt info and location. Will fax note to ENT

## 2017-01-23 DIAGNOSIS — J392 Other diseases of pharynx: Secondary | ICD-10-CM | POA: Diagnosis not present

## 2017-01-23 DIAGNOSIS — Z7982 Long term (current) use of aspirin: Secondary | ICD-10-CM | POA: Diagnosis not present

## 2017-01-23 DIAGNOSIS — J342 Deviated nasal septum: Secondary | ICD-10-CM | POA: Diagnosis not present

## 2017-01-23 DIAGNOSIS — R040 Epistaxis: Secondary | ICD-10-CM | POA: Diagnosis not present

## 2017-02-17 NOTE — Pre-Procedure Instructions (Signed)
Brittany Huerta  02/17/2017      [facility] [phone] - [place] - [address] [facility] [address] [place] [id number] Phone: [phone] Fax: [phone]    Your procedure is scheduled on Friday, 02/20/2017.  [facility] at 0530 A.M.  Call this number if you have problems the morning of surgery:  [phone]   Remember:  Do not eat food or drink liquids after midnight Thursday, 02/19/2017.  Take these medicines the morning of surgery with A SIP OF WATER: Acetaminophen - if needed Atenolol (Tenormin) Clonidine (Catapres) Doxazosin (Cardura) Omeprazole (Prilosec)  7 days prior to surgery STOP taking any Aspirin(unless otherwise instructed by your surgeon), Aleve, Naproxen, Ibuprofen, Motrin, Advil, Goody's, BC's, all herbal medications, fish oil, and all vitamins - including diclofenac (Voltaren)   WHAT DO I DO ABOUT MY DIABETES MEDICATION? Marland Kitchen. Do not take oral diabetes medicines (pills) the morning of surgery.   How to Manage Your Diabetes Before and After Surgery  Why is it important to control my blood sugar before and after surgery? . Improving blood sugar levels before and after surgery helps healing and can limit problems. . A way of improving blood sugar control is eating a healthy diet by: o  Eating less sugar and carbohydrates o  Increasing activity/exercise o  Talking with your doctor about reaching your blood sugar goals . High blood sugars (greater than 180 mg/dL) can raise your risk of infections and slow your recovery, so you will need to focus on controlling your diabetes during the weeks before surgery. . Make sure that the doctor who takes care of your diabetes knows about your planned surgery including the date and location.  How do I manage my blood sugar before surgery? . Check your blood sugar at least 4 times a day, starting 2 days before surgery, to make sure that the  level is not too high or low. o Check your blood sugar the morning of your surgery when you wake up and every 2 hours until you get to the Short Stay unit. . If your blood sugar is less than 70 mg/dL, you will need to treat for low blood sugar: o Do not take insulin. o Treat a low blood sugar (less than 70 mg/dL) with  cup of clear juice (cranberry or apple), 4 glucose tablets, OR glucose gel. Recheck blood sugar in 15 minutes after treatment (to make sure it is greater than 70 mg/dL). If your blood sugar is not greater than 70 mg/dL on recheck, call [phone] o  for further instructions. . Report your blood sugar to the short stay nurse when you get to Short Stay.  . If you are admitted to the hospital after surgery: o Your blood sugar will be checked by the staff and you will probably be given insulin after surgery (instead of oral diabetes medicines) to make sure you have good blood sugar levels. o The goal for blood sugar control after surgery is 80-180 mg/dL.      Do not wear jewelry, make-up or nail polish.  Do not wear lotions, powders, or perfumes, or deoderant.  Do not shave 48 hours prior to surgery.  Men may shave face and neck.  Do not bring valuables to the hospital.  [facility] is not responsible for any belongings or valuables.  Contacts, dentures or bridgework may not be worn into surgery.  Leave your suitcase  in the car.  After surgery it may be brought to your room.  For patients admitted to the hospital, discharge time will be determined by your treatment team.  Patients discharged the day of surgery will not be allowed to drive home.   Name and phone number of your driver:    Special instructions:   [facility]- Preparing For Surgery  Before surgery, you can play an important role. Because skin is not sterile, your skin needs to be as free of germs as possible. You can reduce the number of germs on your skin by washing with CHG (chlorahexidine gluconate) Soap  before surgery.  CHG is an antiseptic cleaner which kills germs and bonds with the skin to continue killing germs even after washing.  Please do not use if you have an allergy to CHG or antibacterial soaps. If your skin becomes reddened/irritated stop using the CHG.  Do not shave (including legs and underarms) for at least 48 hours prior to first CHG shower. It is OK to shave your face.  Please follow these instructions carefully.   1. Shower the NIGHT BEFORE SURGERY and the MORNING OF SURGERY with CHG.   2. If you chose to wash your hair, wash your hair first as usual with your normal shampoo.  3. After you shampoo, rinse your hair and body thoroughly to remove the shampoo.  4. Use CHG as you would any other liquid soap. You can apply CHG directly to the skin and wash gently with a scrungie or a clean washcloth.   5. Apply the CHG Soap to your body ONLY FROM THE NECK DOWN.  Do not use on open wounds or open sores. Avoid contact with your eyes, ears, mouth and genitals (private parts). Wash Face and genitals (private parts)  with your normal soap.  6. Wash thoroughly, paying special attention to the area where your surgery will be performed.  7. Thoroughly rinse your body with warm water from the neck down.  8. DO NOT shower/wash with your normal soap after using and rinsing off the CHG Soap.  9. Pat yourself dry with a CLEAN TOWEL.  10. Wear CLEAN PAJAMAS to bed the night before surgery, wear comfortable clothes the morning of surgery  11. Place CLEAN SHEETS on your bed the night of your first shower and DO NOT SLEEP WITH PETS.    Day of Surgery: Shower as stated above. Do not apply any deodorants/lotions. Please wear clean clothes to the hospital/surgery center.      Please read over the following fact sheets that you were given.

## 2017-02-18 ENCOUNTER — Other Ambulatory Visit: Payer: Self-pay

## 2017-02-18 ENCOUNTER — Encounter (HOSPITAL_COMMUNITY)
Admission: RE | Admit: 2017-02-18 | Discharge: 2017-02-18 | Disposition: A | Discharge status: Home / Self Care | Payer: Federal, State, Local not specified - PPO | Source: Ambulatory Visit | Attending: Otolaryngology | Admitting: Otolaryngology

## 2017-02-18 ENCOUNTER — Encounter (HOSPITAL_COMMUNITY): Payer: Self-pay

## 2017-02-18 DIAGNOSIS — I10 Essential (primary) hypertension: Secondary | ICD-10-CM | POA: Diagnosis not present

## 2017-02-18 DIAGNOSIS — Z880 Allergy status to penicillin: Secondary | ICD-10-CM | POA: Diagnosis not present

## 2017-02-18 DIAGNOSIS — R040 Epistaxis: Secondary | ICD-10-CM | POA: Diagnosis not present

## 2017-02-18 DIAGNOSIS — Z79899 Other long term (current) drug therapy: Secondary | ICD-10-CM | POA: Diagnosis not present

## 2017-02-18 DIAGNOSIS — K219 Gastro-esophageal reflux disease without esophagitis: Secondary | ICD-10-CM | POA: Diagnosis not present

## 2017-02-18 DIAGNOSIS — Z7984 Long term (current) use of oral hypoglycemic drugs: Secondary | ICD-10-CM | POA: Diagnosis not present

## 2017-02-18 DIAGNOSIS — E119 Type 2 diabetes mellitus without complications: Secondary | ICD-10-CM | POA: Diagnosis not present

## 2017-02-18 DIAGNOSIS — Z8249 Family history of ischemic heart disease and other diseases of the circulatory system: Secondary | ICD-10-CM | POA: Diagnosis not present

## 2017-02-18 DIAGNOSIS — Z833 Family history of diabetes mellitus: Secondary | ICD-10-CM | POA: Diagnosis not present

## 2017-02-18 DIAGNOSIS — Z888 Allergy status to other drugs, medicaments and biological substances status: Secondary | ICD-10-CM | POA: Diagnosis not present

## 2017-02-18 DIAGNOSIS — Z791 Long term (current) use of non-steroidal anti-inflammatories (NSAID): Secondary | ICD-10-CM | POA: Diagnosis not present

## 2017-02-18 DIAGNOSIS — Z7982 Long term (current) use of aspirin: Secondary | ICD-10-CM | POA: Diagnosis not present

## 2017-02-18 HISTORY — DX: Adverse effect of unspecified anesthetic, initial encounter: T41.45XA

## 2017-02-18 HISTORY — DX: Other diseases of pharynx: J39.2

## 2017-02-18 HISTORY — DX: Nausea with vomiting, unspecified: Z98.890

## 2017-02-18 HISTORY — DX: Epistaxis: R04.0

## 2017-02-18 HISTORY — DX: Other complications of anesthesia, initial encounter: T88.59XA

## 2017-02-18 HISTORY — DX: Unspecified osteoarthritis, unspecified site: M19.90

## 2017-02-18 HISTORY — DX: Gastro-esophageal reflux disease without esophagitis: K21.9

## 2017-02-18 HISTORY — DX: Nausea with vomiting, unspecified: R11.2

## 2017-02-18 HISTORY — DX: Other specified postprocedural states: Z98.890

## 2017-02-18 LAB — BASIC METABOLIC PANEL
ANION GAP: 9 (ref 5–15)
BUN: 15 mg/dL (ref 6–20)
CALCIUM: 9.4 mg/dL (ref 8.9–10.3)
CO2: 25 mmol/L (ref 22–32)
Chloride: 103 mmol/L (ref 101–111)
Creatinine, Ser: 0.86 mg/dL (ref 0.44–1.00)
GFR calc Af Amer: >60 mL/min (ref >60)
GLUCOSE: 126 mg/dL — AB (ref 65–99)
Potassium: 4.1 mmol/L (ref 3.5–5.1)
Sodium: 137 mmol/L (ref 135–145)

## 2017-02-18 LAB — CBC
HCT: 38.2 % (ref 36.0–46.0)
HEMOGLOBIN: 12.8 g/dL (ref 12.0–15.0)
MCH: 29.4 pg (ref 26.0–34.0)
MCHC: 33.5 g/dL (ref 30.0–36.0)
MCV: 87.8 fL (ref 78.0–100.0)
PLATELETS: 357 10*3/uL (ref 150–400)
RBC: 4.35 MIL/uL (ref 3.87–5.11)
RDW: 14 % (ref 11.5–15.5)
WBC: 6.2 10*3/uL (ref 4.0–10.5)

## 2017-02-18 LAB — GLUCOSE, CAPILLARY: Glucose-Capillary: 118 mg/dL — ABNORMAL HIGH (ref 65–99)

## 2017-02-18 NOTE — Progress Notes (Signed)
PCP - Dr. Danelle EarthlyNoel Huerta  Cardiologist - Denies  Chest x-ray - Denies  EKG - 07/2016-Requested  Stress Test - Denies  ECHO - 07/2016-Requested  Cardiac Cath - Denies  Sleep Study - Denies CPAP - None  HA1C-Pending Fasting Blood Sugar - 110, Today 118 Checks Blood Sugar __1___ time a week  Pt sts she had a Stress test and an EKG in May 2018 at [facility] in [place]. Pt advised that an EKG will be obtained DOS if records are not received. Chart will be given to anesthesia for review due to requested records.  Pt denies having chest pain, sob, or fever at this time. All instructions explained to the pt, with a verbal understanding of the material. Pt agrees to go over the instructions while at home for a better understanding. The opportunity to ask questions was provided.

## 2017-02-18 NOTE — Progress Notes (Signed)
Per GoodlandShanika from the lab, there was a malfunction with the A1C machine, and the pt's lab had to be sent [facility] to [facility] Therefore, the results may be later on today or tomorrow. Note left on the front of the chart to f/u.

## 2017-02-19 LAB — HEMOGLOBIN A1C
Hgb A1c MFr Bld: 6.2 % — ABNORMAL HIGH (ref 4.8–5.6)
Mean Plasma Glucose: 131 mg/dL

## 2017-02-19 NOTE — Progress Notes (Signed)
Anesthesia Chart Review: Patient is a 56 year old female scheduled for nasal endoscopy with biopsy of nasopharyngeal lesion, nasal cautery on 02/20/17 by Dr. Billy FischerAmanda Marcellino.  History includes never smoker, post-operative N/V, HTN, DM2, GERD, allergic rhinitis, nasopharyngeal mass with recurrent right epistaxis, hysterectomy. BMI is consistent with obesity (borderline morbid obesity).   - PCP is Noelle Redmon, PA-C. Her notes indicate that patient's HTN is treated by Dr. Lowell GuitarPowell.  - She saw pulmonologist Dr. Rigoberto NoelVinod Jona with [facility] [place] hospitalization for pneumonia and mononucleosis 07/2016. On 08/20/16 a bronchoscopy was attempted but aborted due to significant epistaxis. She saw ENT Dr. Arliss Journeyavid Rosi Mora Appl([facility]) for nasal cautery on the right. Patient reported follow-up chest CT 09/2106 was "clear." (Patient's summary of events is scanned under Media tab.)  Meds include atenolol, clonidine, doxazosin, Farxiga, Xyzal, metformin, Singulair, Prilosec, Zantac, torsemide, Uloric.  BP (!) 141/63   Pulse 68   Temp 36.7 C   Resp 20   Ht 5\' 6"  (1.676 m)   Wt 247 lb 6.4 oz (112.2 kg)   SpO2 99%   BMI 39.93 kg/m   EKG 08/18/16 [facility]): Normal sinus rhythm, low voltage QRS, cannot rule out anterior infarct (cited on or before 08/15/16).  Echocardiogram 08/19/16 [facility]): Conclusions: 1. Left ventricle: The cavity size is normal. There is mild concentric hypertrophy. Systolic function was normal. The estimated ejection fraction was in the range of 55-60%. Wall motion was normal. There were no regional wall motion abnormalities. Doppler parameters are consistent with abnormal left ventricular relaxation (grade 1 diastolic dysfunction). 2. Left atrium: The left atrium was mildly dilated. 3. Atrial septum: No defect or patent foramen ovale was identified.  Preoperative labs noted. CBC WNL. Cr 0.86. A1c 6.2.   If no acute changes then I anticipate she can proceed as  planned.  Velna Ochsllison Bianey Tesoro, PA-C [facility] Short Stay Center/Anesthesiology Phone [phone] 02/19/2017 3:58 PM

## 2017-02-19 NOTE — Anesthesia Preprocedure Evaluation (Addendum)
Anesthesia Evaluation  Patient identified by MRN, date of birth, ID band Patient awake    Reviewed: Allergy & Precautions, NPO status , Patient's Chart, lab work & pertinent test results  History of Anesthesia Complications (+) PONV and history of anesthetic complications  Airway Mallampati: III  TM Distance: >3 FB Neck ROM: Full    Dental no notable dental hx. (+) Dental Advisory Given   Pulmonary neg pulmonary ROS,    Pulmonary exam normal        Cardiovascular hypertension, Pt. on medications and Pt. on home beta blockers negative cardio ROS Normal cardiovascular exam     Neuro/Psych negative neurological ROS     GI/Hepatic Neg liver ROS, GERD  Medicated,  Endo/Other  negative endocrine ROSdiabetes  Renal/GU negative Renal ROS     Musculoskeletal negative musculoskeletal ROS (+)   Abdominal   Peds  Hematology negative hematology ROS (+)   Anesthesia Other Findings Day of surgery medications reviewed with the patient.  Reproductive/Obstetrics                           Anesthesia Physical Anesthesia Plan  ASA: II  Anesthesia Plan: MAC   Post-op Pain Management:    Induction:   PONV Risk Score and Plan: 4 or greater and Ondansetron, Dexamethasone, Scopolamine patch - Pre-op and Diphenhydramine  Airway Management Planned: Oral ETT and Video Laryngoscope Planned  Additional Equipment:   Intra-op Plan:   Post-operative Plan:   Informed Consent: I have reviewed the patients History and Physical, chart, labs and discussed the procedure including the risks, benefits and alternatives for the proposed anesthesia with the patient or authorized representative who has indicated his/her understanding and acceptance.   Dental advisory given  Plan Discussed with: CRNA, Anesthesiologist and Surgeon  Anesthesia Plan Comments:       Anesthesia Quick Evaluation

## 2017-02-20 ENCOUNTER — Ambulatory Visit (HOSPITAL_COMMUNITY): Payer: Federal, State, Local not specified - PPO | Admitting: Anesthesiology

## 2017-02-20 ENCOUNTER — Encounter (HOSPITAL_COMMUNITY): Payer: Self-pay

## 2017-02-20 ENCOUNTER — Encounter (HOSPITAL_COMMUNITY)
Admission: RE | Disposition: A | Discharge status: Home / Self Care | Payer: Self-pay | Source: Ambulatory Visit | Attending: Otolaryngology

## 2017-02-20 ENCOUNTER — Ambulatory Visit (HOSPITAL_COMMUNITY)
Admission: RE | Admit: 2017-02-20 | Discharge: 2017-02-20 | Disposition: A | Discharge status: Home / Self Care | Payer: Federal, State, Local not specified - PPO | Source: Ambulatory Visit | Attending: Otolaryngology | Admitting: Otolaryngology

## 2017-02-20 DIAGNOSIS — Z880 Allergy status to penicillin: Secondary | ICD-10-CM | POA: Insufficient documentation

## 2017-02-20 DIAGNOSIS — Z7984 Long term (current) use of oral hypoglycemic drugs: Secondary | ICD-10-CM | POA: Insufficient documentation

## 2017-02-20 DIAGNOSIS — Z833 Family history of diabetes mellitus: Secondary | ICD-10-CM | POA: Diagnosis not present

## 2017-02-20 DIAGNOSIS — Z888 Allergy status to other drugs, medicaments and biological substances status: Secondary | ICD-10-CM | POA: Diagnosis not present

## 2017-02-20 DIAGNOSIS — Z8249 Family history of ischemic heart disease and other diseases of the circulatory system: Secondary | ICD-10-CM | POA: Insufficient documentation

## 2017-02-20 DIAGNOSIS — R040 Epistaxis: Secondary | ICD-10-CM | POA: Diagnosis not present

## 2017-02-20 DIAGNOSIS — Z791 Long term (current) use of non-steroidal anti-inflammatories (NSAID): Secondary | ICD-10-CM | POA: Insufficient documentation

## 2017-02-20 DIAGNOSIS — E119 Type 2 diabetes mellitus without complications: Secondary | ICD-10-CM | POA: Insufficient documentation

## 2017-02-20 DIAGNOSIS — Z79899 Other long term (current) drug therapy: Secondary | ICD-10-CM | POA: Insufficient documentation

## 2017-02-20 DIAGNOSIS — Z7982 Long term (current) use of aspirin: Secondary | ICD-10-CM | POA: Diagnosis not present

## 2017-02-20 DIAGNOSIS — K219 Gastro-esophageal reflux disease without esophagitis: Secondary | ICD-10-CM | POA: Diagnosis not present

## 2017-02-20 DIAGNOSIS — J392 Other diseases of pharynx: Secondary | ICD-10-CM | POA: Diagnosis not present

## 2017-02-20 DIAGNOSIS — I10 Essential (primary) hypertension: Secondary | ICD-10-CM | POA: Diagnosis not present

## 2017-02-20 HISTORY — PX: NASAL ENDOSCOPY: SHX6577

## 2017-02-20 LAB — GLUCOSE, CAPILLARY
Glucose-Capillary: 135 mg/dL — ABNORMAL HIGH (ref 65–99)
Glucose-Capillary: 128 mg/dL — ABNORMAL HIGH (ref 65–99)

## 2017-02-20 SURGERY — ENDOSCOPY, NOSE
Anesthesia: Monitor Anesthesia Care | Site: Nose

## 2017-02-20 MED ORDER — OXYMETAZOLINE HCL 0.05 % NA SOLN
NASAL | Status: AC
  Filled 2017-02-20: qty 15

## 2017-02-20 MED ORDER — MUPIROCIN 2 % EX OINT
TOPICAL_OINTMENT | CUTANEOUS | Status: DC | PRN
  Administered 2017-02-20: 1 via NASAL

## 2017-02-20 MED ORDER — ROCURONIUM BROMIDE 10 MG/ML (PF) SYRINGE
PREFILLED_SYRINGE | INTRAVENOUS | Status: AC
  Filled 2017-02-20: qty 5

## 2017-02-20 MED ORDER — OXYMETAZOLINE HCL 0.05 % NA SOLN
3.0000 | NASAL | Status: AC
  Administered 2017-02-20: 3 via NASAL
  Filled 2017-02-20: qty 15

## 2017-02-20 MED ORDER — DEXAMETHASONE SODIUM PHOSPHATE 10 MG/ML IJ SOLN
INTRAMUSCULAR | Status: AC
  Filled 2017-02-20: qty 1

## 2017-02-20 MED ORDER — HYDROMORPHONE HCL 1 MG/ML IJ SOLN: 0.2500 mg | INTRAMUSCULAR | Status: DC | PRN

## 2017-02-20 MED ORDER — PROPOFOL 10 MG/ML IV BOLUS
INTRAVENOUS | Status: AC
  Filled 2017-02-20: qty 20

## 2017-02-20 MED ORDER — PROMETHAZINE HCL 25 MG/ML IJ SOLN: 6.2500 mg | INTRAMUSCULAR | Status: DC | PRN

## 2017-02-20 MED ORDER — STERILE WATER FOR IRRIGATION IR SOLN
Status: DC | PRN
  Administered 2017-02-20: 200 mL

## 2017-02-20 MED ORDER — LACTATED RINGERS IV SOLN
INTRAVENOUS | Status: DC | PRN
  Administered 2017-02-20: 07:00:00 via INTRAVENOUS

## 2017-02-20 MED ORDER — LIDOCAINE 2% (20 MG/ML) 5 ML SYRINGE
INTRAMUSCULAR | Status: AC
  Filled 2017-02-20: qty 5

## 2017-02-20 MED ORDER — MIDAZOLAM HCL 5 MG/5ML IJ SOLN
INTRAMUSCULAR | Status: DC | PRN
  Administered 2017-02-20 (×2): 1 mg via INTRAVENOUS

## 2017-02-20 MED ORDER — PROPOFOL 1000 MG/100ML IV EMUL
INTRAVENOUS | Status: AC
  Filled 2017-02-20: qty 100

## 2017-02-20 MED ORDER — OXYMETAZOLINE HCL 0.05 % NA SOLN
NASAL | Status: DC | PRN
Start: 2017-02-20 — End: 2017-02-20
  Administered 2017-02-20: 1 via TOPICAL

## 2017-02-20 MED ORDER — MIDAZOLAM HCL 2 MG/2ML IJ SOLN
INTRAMUSCULAR | Status: AC
  Filled 2017-02-20: qty 2

## 2017-02-20 MED ORDER — SCOPOLAMINE 1 MG/3DAYS TD PT72
1.0000 | MEDICATED_PATCH | TRANSDERMAL | Status: DC
  Administered 2017-02-20: 1.5 mg via TRANSDERMAL
  Filled 2017-02-20: qty 1

## 2017-02-20 MED ORDER — OXYMETAZOLINE HCL 0.05 % NA SOLN
NASAL | Status: DC | PRN
  Administered 2017-02-20: 2 via NASAL

## 2017-02-20 MED ORDER — SODIUM CHLORIDE 0.9 % IR SOLN
Status: DC | PRN
  Administered 2017-02-20: 1000 mL

## 2017-02-20 MED ORDER — LIDOCAINE HCL (PF) 1 % IJ SOLN
INTRAMUSCULAR | Status: AC
  Filled 2017-02-20: qty 5

## 2017-02-20 MED ORDER — LIDOCAINE-EPINEPHRINE 1 %-1:100000 IJ SOLN
INTRAMUSCULAR | Status: AC
  Filled 2017-02-20: qty 1

## 2017-02-20 MED ORDER — MUPIROCIN 2 % EX OINT
TOPICAL_OINTMENT | CUTANEOUS | Status: AC
  Filled 2017-02-20: qty 22

## 2017-02-20 MED ORDER — LIDOCAINE-EPINEPHRINE 1 %-1:100000 IJ SOLN
INTRAMUSCULAR | Status: DC | PRN
  Administered 2017-02-20: 20 mL

## 2017-02-20 MED ORDER — ONDANSETRON HCL 4 MG/2ML IJ SOLN
INTRAMUSCULAR | Status: DC | PRN
  Administered 2017-02-20: 4 mg via INTRAVENOUS

## 2017-02-20 MED ORDER — ONDANSETRON HCL 4 MG/2ML IJ SOLN
INTRAMUSCULAR | Status: AC
  Filled 2017-02-20: qty 2

## 2017-02-20 MED ORDER — FENTANYL CITRATE (PF) 250 MCG/5ML IJ SOLN
INTRAMUSCULAR | Status: AC
  Filled 2017-02-20: qty 5

## 2017-02-20 SURGICAL SUPPLY — 48 items
ATTRACTOMAT 16X20 MAGNETIC DRP (DRAPES) IMPLANT
BLADE RAD 40 CVD SINUS 4MM (BLADE) IMPLANT
BLADE RAD40 ROTATE 4M 4 5PK (BLADE) IMPLANT
BLADE RAD60 ROTATE M4 4 5PK (BLADE) IMPLANT
BLADE ROTATE TRICUT 4X13 M4 (BLADE) IMPLANT
BLADE SURG 15 STRL LF DISP TIS (BLADE) IMPLANT
BLADE SURG 15 STRL SS (BLADE)
BLADE TRICUT ROTATE M4 4 5PK (BLADE) ×1 IMPLANT
CANISTER SUCT 3000ML PPV (MISCELLANEOUS) ×2 IMPLANT
COAGULATOR SUCT SWTCH 10FR 6 (ELECTROSURGICAL) ×1 IMPLANT
CRADLE DONUT ADULT HEAD (MISCELLANEOUS) ×1 IMPLANT
DRAPE HALF SHEET 40X57 (DRAPES) ×1 IMPLANT
DRESSING NASAL POPE 10X1.5X2.5 (GAUZE/BANDAGES/DRESSINGS) IMPLANT
DRESSING TELFA 8X10 (GAUZE/BANDAGES/DRESSINGS) IMPLANT
DRSG NASAL POPE 10X1.5X2.5 (GAUZE/BANDAGES/DRESSINGS)
DRSG NASOPORE 8CM (GAUZE/BANDAGES/DRESSINGS) IMPLANT
ELECT REM PT RETURN 9FT ADLT (ELECTROSURGICAL) ×2
ELECTRODE REM PT RTRN 9FT ADLT (ELECTROSURGICAL) IMPLANT
GLOVE BIO SURGEON STRL SZ 6.5 (GLOVE) ×2 IMPLANT
GLOVE SURG SS PI 6.0 STRL IVOR (GLOVE) ×1 IMPLANT
GOWN STRL REUS W/ TWL LRG LVL3 (GOWN DISPOSABLE) ×2 IMPLANT
GOWN STRL REUS W/TWL LRG LVL3 (GOWN DISPOSABLE) ×4
KIT BASIN OR (CUSTOM PROCEDURE TRAY) ×2 IMPLANT
KIT ROOM TURNOVER OR (KITS) ×2 IMPLANT
NDL HYPO 25GX1X1/2 BEV (NEEDLE) IMPLANT
NDL PRECISIONGLIDE 27X1.5 (NEEDLE) ×1 IMPLANT
NDL SPNL 22GX3.5 QUINCKE BK (NEEDLE) IMPLANT
NEEDLE HYPO 25GX1X1/2 BEV (NEEDLE) IMPLANT
NEEDLE PRECISIONGLIDE 27X1.5 (NEEDLE) ×2 IMPLANT
NEEDLE SPNL 22GX3.5 QUINCKE BK (NEEDLE) ×2 IMPLANT
NS IRRIG 1000ML POUR BTL (IV SOLUTION) ×2 IMPLANT
PAD ARMBOARD 7.5X6 YLW CONV (MISCELLANEOUS) ×3 IMPLANT
PAD ENT ADHESIVE 25PK (MISCELLANEOUS) ×1 IMPLANT
PATTIES SURGICAL .5 X3 (DISPOSABLE) ×1 IMPLANT
SHEATH ENDOSCRUB 0 DEG (SHEATH) IMPLANT
SHEATH ENDOSCRUB 30 DEG (SHEATH) IMPLANT
SHEATH ENDOSCRUB 45 DEG (SHEATH) IMPLANT
SOLUTION ANTI FOG 6CC (MISCELLANEOUS) ×2 IMPLANT
SUT ETHILON 3 0 PS 1 (SUTURE) IMPLANT
SWAB COLLECTION DEVICE MRSA (MISCELLANEOUS) IMPLANT
SYR 50ML SLIP (SYRINGE) IMPLANT
SYR 5ML LUER SLIP (SYRINGE) ×1 IMPLANT
TRACKER ENT INSTRUMENT (MISCELLANEOUS) IMPLANT
TRACKER ENT PATIENT (MISCELLANEOUS) IMPLANT
TRAY ENT MC OR (CUSTOM PROCEDURE TRAY) ×2 IMPLANT
TUBE CONNECTING 12X1/4 (SUCTIONS) ×1 IMPLANT
TUBING STRAIGHTSHOT EPS 5PK (TUBING) IMPLANT
WATER STERILE IRR 1000ML POUR (IV SOLUTION) ×2 IMPLANT

## 2017-02-20 NOTE — Op Note (Signed)
DATE OF PROCEDURE:  02/20/2017     PRE-OPERATIVE DIAGNOSIS:  NASOPHARYNGEAL LESION  EPISTAXIS     POST-OPERATIVE DIAGNOSIS:  Epistaxis    PROCEDURE(S):  Nasal endoscopy, right side Anterior nasal cautery using electrocautery (Bovie)    SURGEON:  Gavin Pound, MD    ASSISTANT(S):  none    ANESTHESIA: MAC     ESTIMATED BLOOD LOSS:  1 mL    SPECIMENS:  none    COMPLICATIONS:  None     OPERATIVE FINDINGS: The nasopharyngeal lesion which was present within the adenoid tissue when she was examined with a telescope in clinic had completely resolved. There were no abnormalities in her adenoid bed on examination today. Therefore, no biopsy was obtained. The right side of her anterior septum was cauterized.     OPERATIVE DETAILS: The patient was brought to the operating room and placed in the semi-recumbent position. MAC was induced. The patient's nose was decongested with a 50/50 mix of Afrin mixed with 1%lidocaine. Next, the 0 degree 58m nasal endoscope was used to examine the right side of the patient's nose. The right inferior turbinate, middle turbinate and nasal floor were normal in appearance. The torus tubarius was normal bilaterally. There was a normal adenoid bed. The previously hypervascular lesion within her adenoid bed had completely resolved and was not present today. Therefore, biopsy was not performed. Next, the right anterior septum was anesthetized with 1%lidocaine with 1:100,000 epinephrine. With Bovie suction cautery set on 8, the right anterior septum was cauterized in the region of Kiesselbach's plexus. All instrumentation was removed. Mupiricin ointment was then applied. The patient was returned to the care of the anesthesia staff and taken to PACU in good condition.

## 2017-02-20 NOTE — Transfer of Care (Signed)
Immediate Anesthesia Transfer of Care Note  Patient: Brittany Huerta  Procedure(s) Performed: ENDOSCOPIC NASAL PHARYNX BIOPSY,NASAL CAUTERY (N/A )  Patient Location: PACU  Anesthesia Type:MAC  Level of Consciousness: awake, alert , oriented and patient cooperative  Airway & Oxygen Therapy: Patient Spontanous Breathing  Post-op Assessment: Report given to RN, Post -op Vital signs reviewed and stable and Patient moving all extremities X 4  Post vital signs: Reviewed and stable  Last Vitals:  Vitals:   02/20/17 0759 02/20/17 0800  BP: (!) 119/58 (!) 119/58  Pulse: 63 62  Resp: 14 16  Temp: 36.9 C   SpO2: 100% 100%    Last Pain:  Vitals:   02/20/17 0759  TempSrc:   PainSc: 0-No pain         Complications: No apparent anesthesia complications

## 2017-02-20 NOTE — Anesthesia Postprocedure Evaluation (Signed)
Anesthesia Post Note  Patient: KIMBERLLY NORGARD  Procedure(s) Performed: ENDOSCOPIC NASAL NASAL CAUTERY (N/A Nose)     Patient location during evaluation: PACU Anesthesia Type: MAC Level of consciousness: awake and alert Pain management: pain level controlled Vital Signs Assessment: post-procedure vital signs reviewed and stable Respiratory status: spontaneous breathing, nonlabored ventilation, respiratory function stable and patient connected to nasal cannula oxygen Cardiovascular status: stable and blood pressure returned to baseline Postop Assessment: no apparent nausea or vomiting Anesthetic complications: no    Last Vitals:  Vitals:   02/20/17 0830 02/20/17 0837  BP: 129/61   Pulse: 68 71  Resp: 19 16  Temp: (!) 36.3 C   SpO2: 100% 100%    Last Pain:  Vitals:   02/20/17 0830  TempSrc:   PainSc: 0-No pain                 Terence Bart DANIEL

## 2017-02-20 NOTE — H&P (Signed)
The patient's medical and surgical history remains accurate and without interval change. The condition still exists which makes the procedure necessary. Informed consent obtained today. The patient wishes to proceed with surgery.   Otolaryngology New Patient Note  Subjective: [name] is a 56 y.o. female kindly referred by Redmon, Ninfa LindenNoelle Huerta,* for evaluation of recurrent right-sided epistaxis. In June, she had the right side of her nose bleeding. She has had episodes (several in March, May, June, cautery done June 11th- Dr. Andrey Farmerossi [facility] [place], started again September 26th, October 9th). Has tried placing mupricin ointment on the right side of her nose TID. Has mildly improved. On 81mg  ASA daily. Epistaxis is only right-sided, lasts ~20 minutes. Often stops with Afrin and cotton ball. BP tends to be 120's and 130s. She has tried Ayr gel to her nose in the past. She has not used an air humidifier recently but owns one. No bleeding disorders in her family, no facial pain, no neck masses, no throat pain.  Non-smoker.   Past Medical History:  Diagnosis Date  . Diabetes mellitus (HCC)  . Headache   Past Surgical History:  Procedure Laterality Date  . HYSTERECTOMY   Family History  Problem Relation Age of Onset  . Cancer Mother  . Diabetes Mother  . Heart attack Father   Social History   Social History  . Marital status: Unknown  Spouse name: N/A  . Number of children: N/A  . Years of education: N/A   Occupational History  . Not on file.   Social History Main Topics  . Smoking status: Never Smoker  . Smokeless tobacco: Never Used  . Alcohol use No  . Drug use: No  . Sexual activity: Not on file   Other Topics Concern  . Not on file   Social History Narrative  . No narrative on file   Allergies  Allergen Reactions  . Ace Inhibitors Swelling (ALLERGY/intolerance)  . Atorvastatin Other (See Comments)  Leg cramps  . Buchu-Cornsilk-Ch Grass-Hydran Swelling  (ALLERGY/intolerance)  . Hydrochlorothiazide Swelling (ALLERGY/intolerance)  . Penicillins Itching / Pruritis (ALLERGY/intolerance), Nausea (intolerance) and Swelling (ALLERGY/intolerance)  . Arb-Angiotensin Receptor Antagonist Rash (ALLERGY/intolerance) and Swelling (ALLERGY/intolerance)   Updated Medication List:   cloNIDine HCl (CATAPRES) 0.3 MG tablet  Sig - Route: Take by mouth. - Oral  Class: Historical Med  diclofenac (VOLTAREN) 50 MG EC tablet  Sig - Route: Take by mouth. - Oral  Class: Historical Med  doxazosin (CARDURA) 8 MG tablet  Sig - Route: Take by mouth. - Oral  Class: Historical Med  levocetirizine (XYZAL) 5 MG tablet  Sig - Route: Take by mouth. - Oral  Class: Historical Med  metFORMIN (GLUCOPHAGE) 1000 MG tablet  Sig - Route: Take by mouth. - Oral  Class: Historical Med  montelukast (SINGULAIR) 10 mg tablet  Sig - Route: Take by mouth. - Oral  Class: Historical Med  ranitidine (ZANTAC) 300 MG tablet  Sig: Take one tablet daily at bedtime  Class: Historical Med  AFLURIA QUAD 2018-2019, PF, 60 mcg/0.5 mL Syrg  Sig: TO BE ADMINISTERED BY PHARMACIST FOR IMMUNIZATION  Class: Historical Med  atenolol (TENORMIN) 50 MG tablet  Sig: TK 3 TS PO QD  Class: Historical Med  CONTOUR NEXT TEST STRIPS Strp  Sig: USE TO CHECK BLOOD SUGARS ONCE A DAY  Class: Historical Med  diphenhydrAMINE (BENADRYL) 25 mg capsule  Sig - Route: Take by mouth. - Oral  Class: Historical Med  FARXIGA 5 mg tablet  Sig: TK 1 T PO D  Class: Historical Med  multivitamin with minerals tablet  Sig - Route: Take 1 tablet by mouth. - Oral  Class: Historical Med  mupirocin (BACTROBAN) 2 % ointment  Sig: APP AA TID FOR 10 DAYS UTD  Class: Historical Med  omeprazole (PRILOSEC) 20 MG capsule  Sig: TK 1 C PO QD  Class: Historical Med  torsemide (DEMADEX) 20 MG tablet  Sig: TK 1 T PO BID  Class: Historical Med  ULORIC 40 mg tablet  Sig: TK 1 T PO D  Class: Historical Med    ROS A 12-pt  review of systems was conducted and was negative except as stated in the HPI.   Objective:  Vitals:   02/20/17 0546  BP: (!) 125/57  Pulse: 71  Resp: 18  Temp: 98.7 F (37.1 C)  SpO2: 100%     Physical Exam:  General Normocephalic, Awake, Alert and appropriate for the exam  Eyes PERRL, no scleral icterus or conjunctival hemorrhage.  EOMI.  Ears Right ear- EAC patent, no obstructing cerumen. TM: intact, no effusion, no retraction, normal landmarks Left ear- EAC patent, no obstructing cerumen. TM: intact, no effusion, no retraction, normal landmarks  Nose Patent, No polyps or masses seen. Mildly deviated septum to the right. Perhaps very mildly (very mild) prominent blood vessel in the right anterior septum. Nothing on left.  Oral Pharynx No mucosal lesions or tumors seen. Could not visualize tonsil bed 2/2 patient intolerance. Tongue broke the tongue depressor in resisting posterior pharyngeal examination.  Dentition is grossly normal.  Lymphatics No cervical lymphadenopathy or masses on palpation  Endocrine No thyroidmegaly, no thyroid masses palpated  Cardio-vascular No cyanosis, regular rate  Pulmonary No audible stridor, Breathing easily with no labor. No dysphonia.  Neuro Symmetric facial movement.  Tongue protrudes in midline.  Psychiatry Appropriate affect and mood for clinic visit.  Skin No scars or lesions on face or neck.    The patient tolerated the procedure well.   Assessment:  My impression is that Brittany Huerta has  1. Lesion of nasopharynx  2. Recurrent epistaxis  . Plan:  1. We will plan for endoscopic biopsy of right nasopharynx lesion in the operating room. At the same time, we will perform right nasal cautery

## 2017-02-20 NOTE — Discharge Instructions (Addendum)
-  Avoid nose-blowing x 3 days.  -Apply mupiricin ointment to right side of nose twice daily for 5 days, then stop. This area may be slightly sore.  -Use nasal saline spray in your nose throughout the day (approximately every 2-4 hours while awake) to help prevent dryness -Stop using the Dymista nasal spray for 10 days.   .Dr. Doran HeaterMarcellino will see you back in approximately 3 weeks. Byrd HesselbachMaria will call you with an appointment time for your followup.

## 2017-02-21 ENCOUNTER — Encounter (HOSPITAL_COMMUNITY): Payer: Self-pay | Admitting: Otolaryngology

## 2017-03-06 ENCOUNTER — Ambulatory Visit (INDEPENDENT_AMBULATORY_CARE_PROVIDER_SITE_OTHER): Payer: Federal, State, Local not specified - PPO | Admitting: Family Medicine

## 2017-03-06 ENCOUNTER — Encounter: Payer: Self-pay | Admitting: Family Medicine

## 2017-03-06 VITALS — BP 128/80 | HR 72 | Resp 16

## 2017-03-06 DIAGNOSIS — R040 Epistaxis: Secondary | ICD-10-CM | POA: Diagnosis not present

## 2017-03-06 DIAGNOSIS — K219 Gastro-esophageal reflux disease without esophagitis: Secondary | ICD-10-CM | POA: Diagnosis not present

## 2017-03-06 DIAGNOSIS — J3089 Other allergic rhinitis: Secondary | ICD-10-CM | POA: Insufficient documentation

## 2017-03-06 MED ORDER — RANITIDINE HCL 150 MG PO TABS: ORAL_TABLET | ORAL | 5 refills | Status: AC

## 2017-03-06 NOTE — Progress Notes (Signed)
[address] [place] [id number] Dept: [phone]  FAMILY NURSE PRACTITIONER FOLLOW UP NOTE  Patient ID: Brittany Huerta, female    DOB: Dec 25, 1960  Age: 56 y.o. MRN: [id number] Date of Office Visit: 03/06/2017  Assessment  Chief Complaint: Allergic Rhinitis  and Sinus Problem  HPI Brittany Huerta is a 56 year old female patient who presents to the clinic today for follow up of nasal congestion, post nasal drainage, and frequent throat clearing. She was last seen in this office by Thermon LeylandAnne Kanishk Stroebel FNP on [id number] for evaluation of sinus congestion, post nasal drip, and voice hoarseness. At that visit, she reported her rhinitis as well controlled with the use of Xyzal 5 mg daily and montelukast 10 mg daily. She had not started any nasal steroids due to the fact that she had recently begun to have nosebleeds, three in one month, which resolved with placement of an oxymetazoline soaked cotton ball. She was referred to ENT and had an endoscopic nasal cautery procedure on 02/20/2017.   At today's visit, she is reporting her nasal congestion and rhinitis has been well controlled. She continues the Xyzal and montelukast daily. She reports she has not had an episode of epistaxis since her nasal cautery procedure. She has a follow up appointment on next week Tuesday with Dr. Doran HeaterMarcellino (Otolaryngology ENT).  She reports having bouts of diarrhea that she believes may be related to the omeprazole for the last 2-3 weeks. These episodes have occurred up to 2 times a day. She denies any new medications or food ingestion. Brittany Huerta is currently taking omeprazole 20 mg a day and ranitidine 300 mg a day. She has decreased her caffeine and chocolate intake and is not felling any outward symptoms of LPD such as heartburn or asthma exacerbation.    Drug Allergies:  Allergies  Allergen Reactions  . Welchol [Colesevelam Hcl] Shortness Of Breath  . Atorvastatin Other (See Comments)    MYALGIAS  . Penicillins Itching,  Nausea Only and Swelling    INTOLERANCE >  NAUSEA SWELLING REACTION UNSPECIFIED  Has patient had a PCN reaction causing immediate rash, facial/tongue/throat swelling, SOB or lightheadedness with hypotension:  #  #  #  NO  #  #  #  Has patient had a PCN reaction causing severe rash involving mucus membranes or skin necrosis:  #  #  #  NO  #  #  #  Has patient had a PCN reaction that required hospitalization:  #  #  #  NO  #  #  #  Has patient had a PCN reaction occurring within the last 10 years:  #  #  #  NO  #  #  #    . Ace Inhibitors Swelling    SWELLING REACTION UNSPECIFIED   . Diuretic [Buchu-Cornsilk-Ch Grass-Hydran] Swelling    UNSPECIFIED REACTION   . Hctz [Hydrochlorothiazide] Swelling    UNSPECIFIED REACTION   . Angiotensin Receptor Blockers Swelling and Rash    SWELLING REACTION UNSPECIFIED     Physical Exam: BP 128/80   Pulse 72   Resp 16    Physical Exam  Constitutional: She is oriented to person, place, and time. She appears well-developed and well-nourished.  HENT:  Head: Normocephalic and atraumatic.  Right Ear: External ear normal.  Left Ear: External ear normal.  Mouth/Throat: Oropharynx is clear and moist.  Bilateral nares erythematous and edematous. Left nare with yellow crusty material noted. No bloody drainage noted. Pharynx normal. Ears normal. Eyes  normal  Eyes: Conjunctivae are normal.  Neck: Normal range of motion. Neck supple.  Cardiovascular: Normal rate, regular rhythm and normal heart sounds.  S1S2 normal. Regular heart rate and rhythm. No murmur noted.   Pulmonary/Chest: Effort normal and breath sounds normal.  Lungs clear to auscultation.   Abdominal: Bowel sounds are normal.  Musculoskeletal: Normal range of motion.  Neurological: She is oriented to person, place, and time.  Skin: Skin is warm and dry.  Psychiatric: She has a normal mood and affect. Her behavior is normal. Judgment and thought content normal.     Assessment and Plan: 1.  LPRD (laryngopharyngeal reflux disease)   2. Other allergic rhinitis   3. Epistaxis     Meds ordered this encounter  Medications  . ranitidine (ZANTAC) 150 MG tablet    Sig: Take one tablet twice daily as directed    Dispense:  60 tablet    Refill:  5    Patient Instructions  1. LPRD (laryngopharyngeal reflux disease) - Stop taking omeprazole - Continue ranitidine 150 twice a day - Continue to decrease caffeine and chocolate  2. Other allergic rhinitis - Stop Dymista nasal spray until cleared by ENT for use  - Continue Xyzal daily as needed - Continue Montelukast daily  3. Recurrent infections and elevated LFT - Immune lab workup normal - Liver function tests noramlizing  4. Epistaxis - Continue follow up with ENT  Follow up in 3 months or sooner if necessary   Return in about 3 months (around 06/04/2017), or if symptoms worsen or fail to improve.   Heer's respiratory issues seem to be well controlled with her current medication regimen including omeprazole, ranitidine, montelukast, and an oral antihistamine. However, she is reporting almost daily diarrhea. At this time, I will stop the omeprazole and continue with the ranitidine 150 mg twice a day in addition to her other medications outlined in the chart She will continue to control caffeine and chocolate intake as well. She will follow up with Dr. Doran HeaterMarcellino on March 10, 2017 and as needed regarding her epistaxis and recent endoscopic nasal cautery.  I will see her back in this office in 3 months or sooner if necessary.   Thank you for the opportunity to care for this patient.  Please do not hesitate to contact me with questions.  Thermon LeylandAnne Nora Rooke, FNP [facility]  I have provided oversight concerning Thermon Leylandnne Gunner Iodice' evaluation and treatment of this patient's health issues addressed during today's encounter. I agree with the assessment and therapeutic plan as outlined in the note.   Signed,    Jessica PriestEric J. Kozlow, MD,  Allergy and Immunology,  [facility] [place].

## 2017-03-06 NOTE — Patient Instructions (Signed)
1. LPRD (laryngopharyngeal reflux disease) - Stop taking omeprazole - Continue ranitidine 150 twice a day - Continue to decrease caffeine and chocolate  2. Other allergic rhinitis - Stop Dymista nasal spray until cleared by ENT for use  - Continue Xyzal daily as needed - Continue Montelukast daily  3. Recurrent infections and elevated LFT - Immune lab workup normal - Liver function tests noramlizing  4. Epistaxis - Continue follow up with ENT  Follow up in 3 months or sooner if necessary

## 2017-05-15 DIAGNOSIS — I10 Essential (primary) hypertension: Secondary | ICD-10-CM | POA: Diagnosis not present

## 2017-05-15 DIAGNOSIS — E119 Type 2 diabetes mellitus without complications: Secondary | ICD-10-CM | POA: Diagnosis not present

## 2017-05-15 DIAGNOSIS — M109 Gout, unspecified: Secondary | ICD-10-CM | POA: Diagnosis not present

## 2017-05-15 DIAGNOSIS — E7800 Pure hypercholesterolemia, unspecified: Secondary | ICD-10-CM | POA: Diagnosis not present

## 2017-06-05 ENCOUNTER — Ambulatory Visit: Payer: Federal, State, Local not specified - PPO | Admitting: Family Medicine

## 2017-07-03 DIAGNOSIS — Z83511 Family history of glaucoma: Secondary | ICD-10-CM | POA: Diagnosis not present

## 2017-07-03 DIAGNOSIS — H40013 Open angle with borderline findings, low risk, bilateral: Secondary | ICD-10-CM | POA: Diagnosis not present

## 2017-07-25 ENCOUNTER — Other Ambulatory Visit: Payer: Self-pay | Admitting: Allergy and Immunology

## 2017-07-27 ENCOUNTER — Other Ambulatory Visit: Payer: Self-pay | Admitting: Allergy and Immunology

## 2017-07-27 ENCOUNTER — Other Ambulatory Visit: Payer: Self-pay | Admitting: Family Medicine

## 2017-10-20 ENCOUNTER — Other Ambulatory Visit: Payer: Self-pay | Admitting: Physician Assistant

## 2017-10-20 DIAGNOSIS — Z1231 Encounter for screening mammogram for malignant neoplasm of breast: Secondary | ICD-10-CM

## 2017-12-04 DIAGNOSIS — E119 Type 2 diabetes mellitus without complications: Secondary | ICD-10-CM | POA: Diagnosis not present

## 2017-12-04 DIAGNOSIS — H353132 Nonexudative age-related macular degeneration, bilateral, intermediate dry stage: Secondary | ICD-10-CM | POA: Diagnosis not present

## 2017-12-08 DIAGNOSIS — R6883 Chills (without fever): Secondary | ICD-10-CM | POA: Diagnosis not present

## 2017-12-08 DIAGNOSIS — Z23 Encounter for immunization: Secondary | ICD-10-CM | POA: Diagnosis not present

## 2017-12-08 DIAGNOSIS — Z0000 Encounter for general adult medical examination without abnormal findings: Secondary | ICD-10-CM | POA: Diagnosis not present

## 2017-12-08 DIAGNOSIS — M109 Gout, unspecified: Secondary | ICD-10-CM | POA: Diagnosis not present

## 2017-12-08 DIAGNOSIS — E1169 Type 2 diabetes mellitus with other specified complication: Secondary | ICD-10-CM | POA: Diagnosis not present

## 2017-12-08 DIAGNOSIS — I10 Essential (primary) hypertension: Secondary | ICD-10-CM | POA: Diagnosis not present

## 2017-12-08 DIAGNOSIS — E7800 Pure hypercholesterolemia, unspecified: Secondary | ICD-10-CM | POA: Diagnosis not present

## 2017-12-08 DIAGNOSIS — R635 Abnormal weight gain: Secondary | ICD-10-CM | POA: Diagnosis not present

## 2017-12-25 ENCOUNTER — Ambulatory Visit
Admission: RE | Admit: 2017-12-25 | Discharge: 2017-12-25 | Disposition: A | Discharge status: Home / Self Care | Payer: Federal, State, Local not specified - PPO | Source: Ambulatory Visit | Attending: Physician Assistant | Admitting: Physician Assistant

## 2017-12-25 DIAGNOSIS — Z1231 Encounter for screening mammogram for malignant neoplasm of breast: Secondary | ICD-10-CM | POA: Diagnosis not present

## 2017-12-28 DIAGNOSIS — J343 Hypertrophy of nasal turbinates: Secondary | ICD-10-CM | POA: Diagnosis not present

## 2017-12-28 DIAGNOSIS — J302 Other seasonal allergic rhinitis: Secondary | ICD-10-CM | POA: Diagnosis not present

## 2018-01-01 DIAGNOSIS — I10 Essential (primary) hypertension: Secondary | ICD-10-CM | POA: Diagnosis not present

## 2018-01-01 DIAGNOSIS — M1710 Unilateral primary osteoarthritis, unspecified knee: Secondary | ICD-10-CM | POA: Diagnosis not present

## 2018-01-01 DIAGNOSIS — E669 Obesity, unspecified: Secondary | ICD-10-CM | POA: Diagnosis not present

## 2018-01-18 ENCOUNTER — Other Ambulatory Visit: Payer: Self-pay | Admitting: Family Medicine

## 2018-02-12 DIAGNOSIS — Z23 Encounter for immunization: Secondary | ICD-10-CM | POA: Diagnosis not present

## 2018-04-14 ENCOUNTER — Other Ambulatory Visit: Payer: Self-pay | Admitting: *Deleted

## 2018-04-14 MED ORDER — FAMOTIDINE 20 MG PO TABS: ORAL_TABLET | ORAL | 0 refills | Status: AC

## 2018-06-04 DIAGNOSIS — H40013 Open angle with borderline findings, low risk, bilateral: Secondary | ICD-10-CM | POA: Diagnosis not present

## 2018-08-06 DIAGNOSIS — E1169 Type 2 diabetes mellitus with other specified complication: Secondary | ICD-10-CM | POA: Diagnosis not present

## 2018-08-06 DIAGNOSIS — M109 Gout, unspecified: Secondary | ICD-10-CM | POA: Diagnosis not present

## 2018-08-06 DIAGNOSIS — I10 Essential (primary) hypertension: Secondary | ICD-10-CM | POA: Diagnosis not present

## 2018-08-06 DIAGNOSIS — E7800 Pure hypercholesterolemia, unspecified: Secondary | ICD-10-CM | POA: Diagnosis not present

## 2018-08-20 DIAGNOSIS — E1169 Type 2 diabetes mellitus with other specified complication: Secondary | ICD-10-CM | POA: Diagnosis not present

## 2018-08-20 DIAGNOSIS — M109 Gout, unspecified: Secondary | ICD-10-CM | POA: Diagnosis not present

## 2018-08-20 DIAGNOSIS — I10 Essential (primary) hypertension: Secondary | ICD-10-CM | POA: Diagnosis not present

## 2018-08-20 DIAGNOSIS — E7800 Pure hypercholesterolemia, unspecified: Secondary | ICD-10-CM | POA: Diagnosis not present

## 2018-11-19 ENCOUNTER — Other Ambulatory Visit: Payer: Self-pay | Admitting: Physician Assistant

## 2018-11-19 DIAGNOSIS — Z1231 Encounter for screening mammogram for malignant neoplasm of breast: Secondary | ICD-10-CM

## 2018-12-10 DIAGNOSIS — E119 Type 2 diabetes mellitus without complications: Secondary | ICD-10-CM | POA: Diagnosis not present

## 2018-12-10 DIAGNOSIS — H353132 Nonexudative age-related macular degeneration, bilateral, intermediate dry stage: Secondary | ICD-10-CM | POA: Diagnosis not present

## 2018-12-17 DIAGNOSIS — E7800 Pure hypercholesterolemia, unspecified: Secondary | ICD-10-CM | POA: Diagnosis not present

## 2018-12-17 DIAGNOSIS — I10 Essential (primary) hypertension: Secondary | ICD-10-CM | POA: Diagnosis not present

## 2018-12-17 DIAGNOSIS — Z0000 Encounter for general adult medical examination without abnormal findings: Secondary | ICD-10-CM | POA: Diagnosis not present

## 2018-12-17 DIAGNOSIS — E1169 Type 2 diabetes mellitus with other specified complication: Secondary | ICD-10-CM | POA: Diagnosis not present

## 2018-12-21 DIAGNOSIS — I10 Essential (primary) hypertension: Secondary | ICD-10-CM | POA: Diagnosis not present

## 2018-12-21 DIAGNOSIS — E7800 Pure hypercholesterolemia, unspecified: Secondary | ICD-10-CM | POA: Diagnosis not present

## 2018-12-21 DIAGNOSIS — E1169 Type 2 diabetes mellitus with other specified complication: Secondary | ICD-10-CM | POA: Diagnosis not present

## 2018-12-21 DIAGNOSIS — M109 Gout, unspecified: Secondary | ICD-10-CM | POA: Diagnosis not present

## 2019-01-07 ENCOUNTER — Ambulatory Visit: Payer: Federal, State, Local not specified - PPO

## 2019-02-25 ENCOUNTER — Other Ambulatory Visit: Payer: Self-pay

## 2019-02-25 ENCOUNTER — Ambulatory Visit
Admission: RE | Admit: 2019-02-25 | Discharge: 2019-02-25 | Disposition: A | Discharge status: Home / Self Care | Payer: Federal, State, Local not specified - PPO | Source: Ambulatory Visit | Attending: Physician Assistant | Admitting: Physician Assistant

## 2019-02-25 DIAGNOSIS — Z1231 Encounter for screening mammogram for malignant neoplasm of breast: Secondary | ICD-10-CM | POA: Diagnosis not present

## 2019-03-31 DIAGNOSIS — E1169 Type 2 diabetes mellitus with other specified complication: Secondary | ICD-10-CM | POA: Diagnosis not present

## 2019-03-31 DIAGNOSIS — M109 Gout, unspecified: Secondary | ICD-10-CM | POA: Diagnosis not present

## 2019-04-12 IMAGING — MG DIGITAL SCREENING BILATERAL MAMMOGRAM WITH CAD
6 series · 6 of 6 positions shown · non-contrast
Comparison: Previous exam(s).

CLINICAL DATA: Screening.

EXAM:
DIGITAL SCREENING BILATERAL MAMMOGRAM WITH CAD

[L CC]
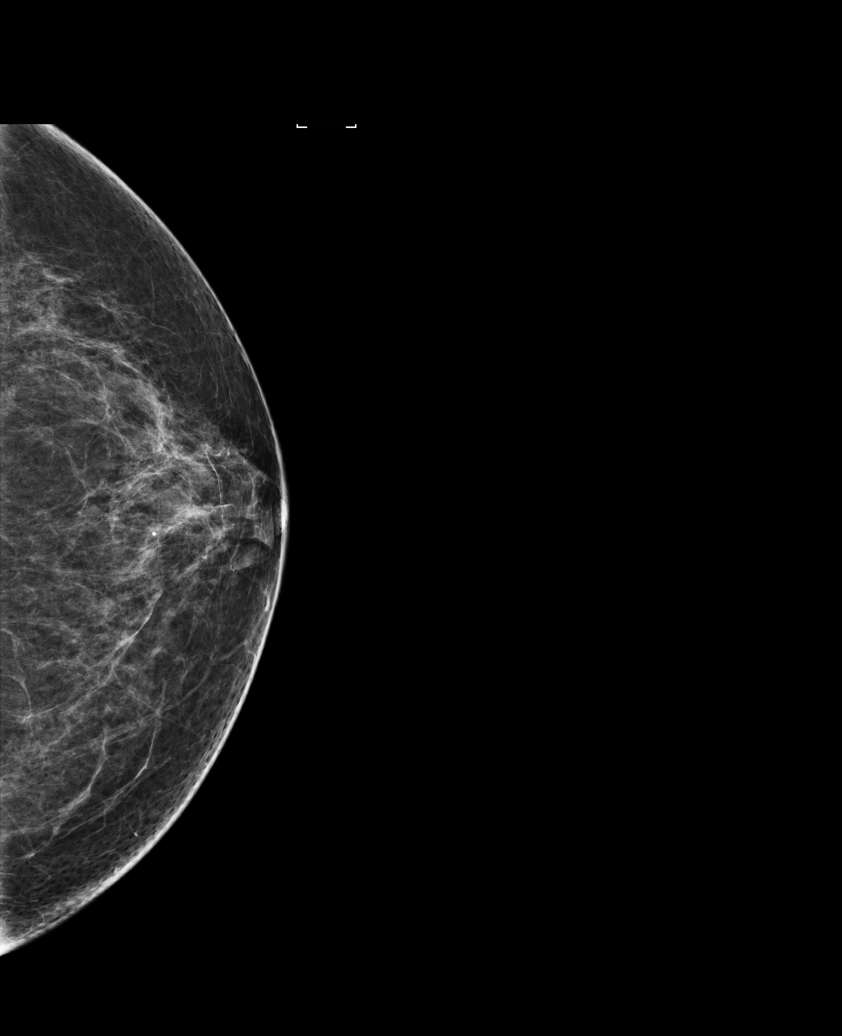

[R CC (1 of 2)]
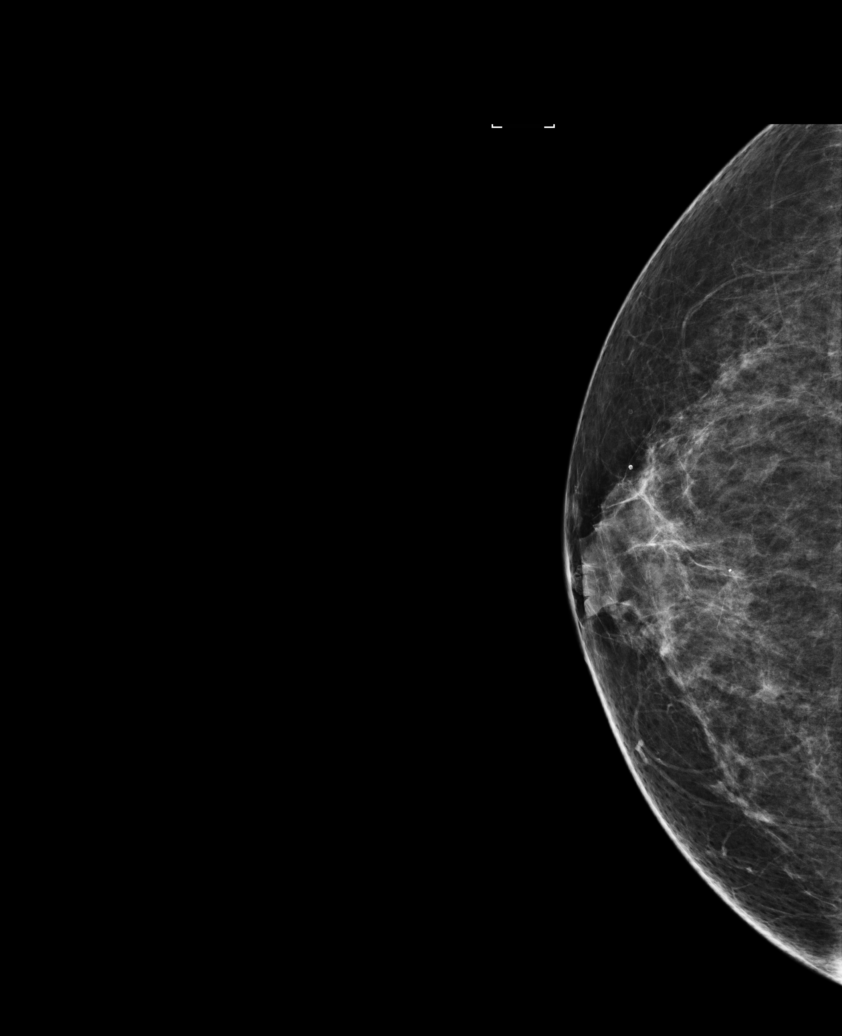

[R MLO]
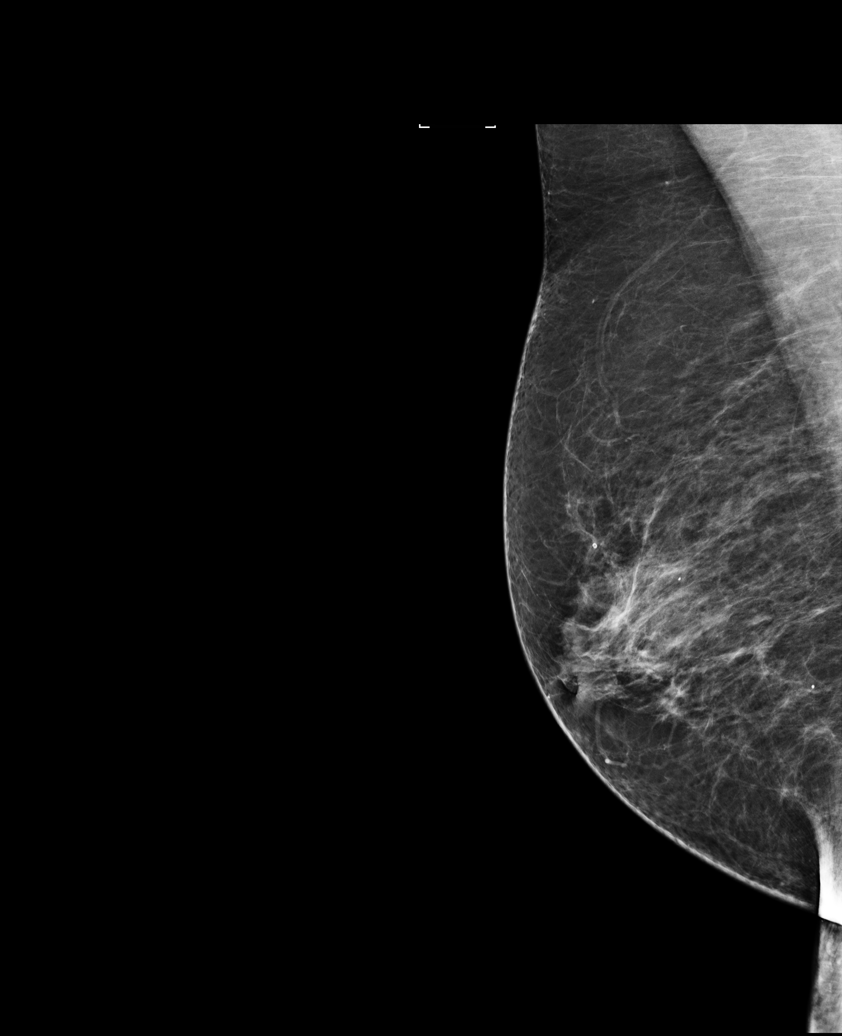

[L MLO (1 of 2)]
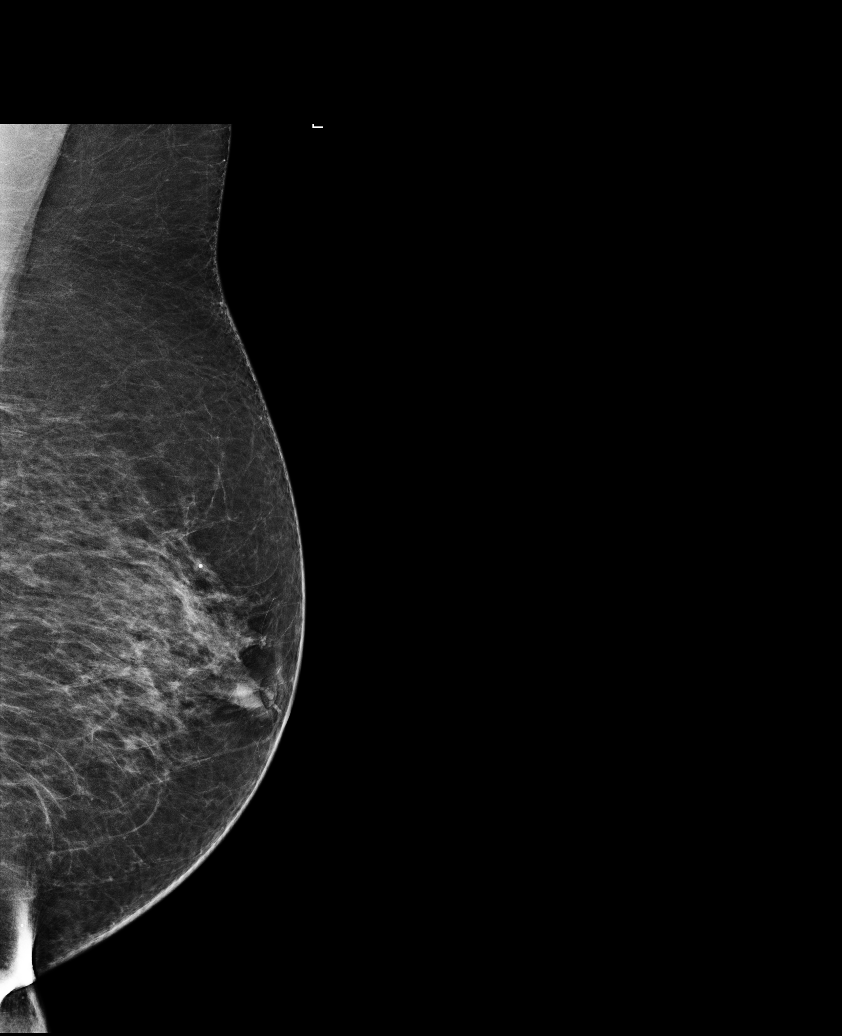

[R CC (2 of 2)]
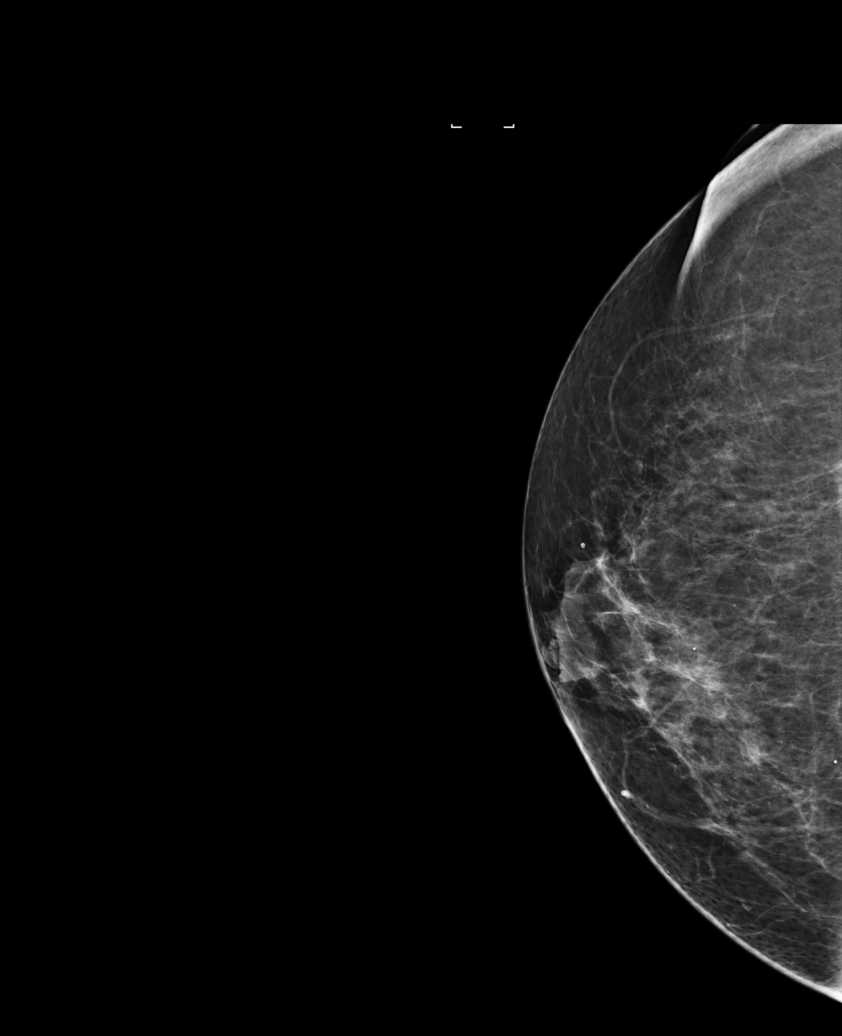

[L MLO (2 of 2)]
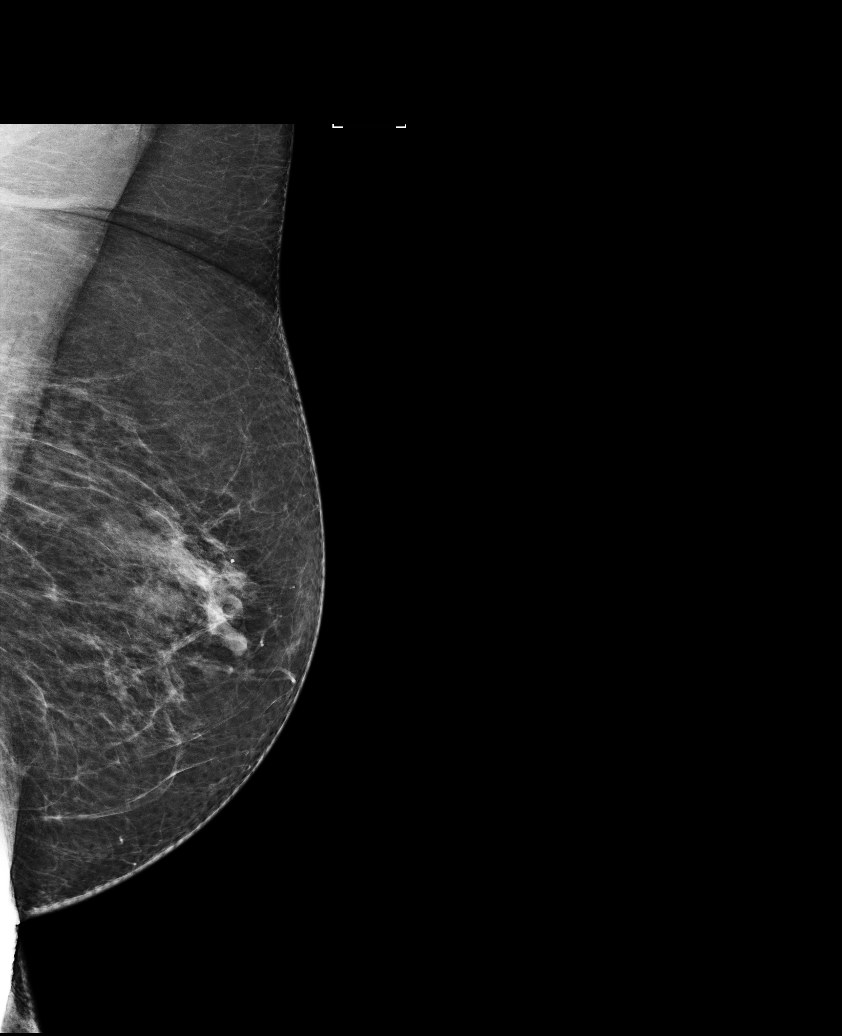

[6 of 6 positions shown; findings below may reference images not displayed]

ACR Breast Density Category b: There are scattered areas of
fibroglandular density.
FINDINGS: There are no findings suspicious for malignancy. Images were
processed with CAD.
IMPRESSION: No mammographic evidence of malignancy. A result letter of this
screening mammogram will be mailed directly to the patient.

RECOMMENDATION:
Screening mammogram in one year. (Code:AS-G-LCT)

BI-RADS CATEGORY  1: Negative.

## 2019-05-28 DIAGNOSIS — L02212 Cutaneous abscess of back [any part, except buttock]: Secondary | ICD-10-CM | POA: Diagnosis not present

## 2019-06-17 DIAGNOSIS — H40012 Open angle with borderline findings, low risk, left eye: Secondary | ICD-10-CM | POA: Diagnosis not present

## 2019-06-17 DIAGNOSIS — H40021 Open angle with borderline findings, high risk, right eye: Secondary | ICD-10-CM | POA: Diagnosis not present

## 2019-07-01 DIAGNOSIS — M109 Gout, unspecified: Secondary | ICD-10-CM | POA: Diagnosis not present

## 2019-07-01 DIAGNOSIS — J309 Allergic rhinitis, unspecified: Secondary | ICD-10-CM | POA: Diagnosis not present

## 2019-07-01 DIAGNOSIS — I10 Essential (primary) hypertension: Secondary | ICD-10-CM | POA: Diagnosis not present

## 2019-07-01 DIAGNOSIS — E1169 Type 2 diabetes mellitus with other specified complication: Secondary | ICD-10-CM | POA: Diagnosis not present

## 2019-07-01 DIAGNOSIS — K219 Gastro-esophageal reflux disease without esophagitis: Secondary | ICD-10-CM | POA: Diagnosis not present

## 2019-07-01 DIAGNOSIS — E7800 Pure hypercholesterolemia, unspecified: Secondary | ICD-10-CM | POA: Diagnosis not present

## 2019-12-16 DIAGNOSIS — E113291 Type 2 diabetes mellitus with mild nonproliferative diabetic retinopathy without macular edema, right eye: Secondary | ICD-10-CM | POA: Diagnosis not present

## 2019-12-23 DIAGNOSIS — E7800 Pure hypercholesterolemia, unspecified: Secondary | ICD-10-CM | POA: Diagnosis not present

## 2019-12-23 DIAGNOSIS — I10 Essential (primary) hypertension: Secondary | ICD-10-CM | POA: Diagnosis not present

## 2019-12-23 DIAGNOSIS — M109 Gout, unspecified: Secondary | ICD-10-CM | POA: Diagnosis not present

## 2019-12-23 DIAGNOSIS — Z23 Encounter for immunization: Secondary | ICD-10-CM | POA: Diagnosis not present

## 2019-12-23 DIAGNOSIS — Z0000 Encounter for general adult medical examination without abnormal findings: Secondary | ICD-10-CM | POA: Diagnosis not present

## 2019-12-23 DIAGNOSIS — E1169 Type 2 diabetes mellitus with other specified complication: Secondary | ICD-10-CM | POA: Diagnosis not present

## 2020-01-25 ENCOUNTER — Other Ambulatory Visit: Payer: Self-pay | Admitting: Physician Assistant

## 2020-01-25 DIAGNOSIS — Z1231 Encounter for screening mammogram for malignant neoplasm of breast: Secondary | ICD-10-CM

## 2020-03-30 ENCOUNTER — Ambulatory Visit: Payer: Federal, State, Local not specified - PPO

## 2020-05-11 ENCOUNTER — Other Ambulatory Visit: Payer: Self-pay

## 2020-05-11 ENCOUNTER — Ambulatory Visit
Admission: RE | Admit: 2020-05-11 | Discharge: 2020-05-11 | Disposition: A | Discharge status: Home / Self Care | Payer: Federal, State, Local not specified - PPO | Source: Ambulatory Visit | Attending: Physician Assistant | Admitting: Physician Assistant

## 2020-05-11 DIAGNOSIS — Z1231 Encounter for screening mammogram for malignant neoplasm of breast: Secondary | ICD-10-CM

## 2021-02-01 DIAGNOSIS — I10 Essential (primary) hypertension: Secondary | ICD-10-CM | POA: Diagnosis not present

## 2021-02-01 DIAGNOSIS — E119 Type 2 diabetes mellitus without complications: Secondary | ICD-10-CM | POA: Diagnosis not present

## 2021-02-01 DIAGNOSIS — G720 Drug-induced myopathy: Secondary | ICD-10-CM | POA: Diagnosis not present

## 2021-02-01 DIAGNOSIS — E1169 Type 2 diabetes mellitus with other specified complication: Secondary | ICD-10-CM | POA: Diagnosis not present

## 2021-02-01 DIAGNOSIS — M109 Gout, unspecified: Secondary | ICD-10-CM | POA: Diagnosis not present

## 2021-02-01 DIAGNOSIS — E7800 Pure hypercholesterolemia, unspecified: Secondary | ICD-10-CM | POA: Diagnosis not present

## 2021-02-01 DIAGNOSIS — R197 Diarrhea, unspecified: Secondary | ICD-10-CM | POA: Diagnosis not present

## 2021-02-25 DIAGNOSIS — R197 Diarrhea, unspecified: Secondary | ICD-10-CM | POA: Diagnosis not present

## 2021-02-25 DIAGNOSIS — K219 Gastro-esophageal reflux disease without esophagitis: Secondary | ICD-10-CM | POA: Diagnosis not present

## 2021-02-27 DIAGNOSIS — R197 Diarrhea, unspecified: Secondary | ICD-10-CM | POA: Diagnosis not present

## 2021-03-06 DIAGNOSIS — R197 Diarrhea, unspecified: Secondary | ICD-10-CM | POA: Diagnosis not present

## 2021-04-12 ENCOUNTER — Other Ambulatory Visit: Payer: Self-pay | Admitting: Physician Assistant

## 2021-04-12 DIAGNOSIS — Z1231 Encounter for screening mammogram for malignant neoplasm of breast: Secondary | ICD-10-CM

## 2021-05-09 DIAGNOSIS — E7800 Pure hypercholesterolemia, unspecified: Secondary | ICD-10-CM | POA: Diagnosis not present

## 2021-05-17 ENCOUNTER — Ambulatory Visit
Admission: RE | Admit: 2021-05-17 | Discharge: 2021-05-17 | Disposition: A | Discharge status: Home / Self Care | Payer: Federal, State, Local not specified - PPO | Source: Ambulatory Visit | Attending: Physician Assistant | Admitting: Physician Assistant

## 2021-05-17 DIAGNOSIS — Z1231 Encounter for screening mammogram for malignant neoplasm of breast: Secondary | ICD-10-CM

## 2021-08-01 DIAGNOSIS — E7800 Pure hypercholesterolemia, unspecified: Secondary | ICD-10-CM | POA: Diagnosis not present

## 2021-08-01 DIAGNOSIS — Z0000 Encounter for general adult medical examination without abnormal findings: Secondary | ICD-10-CM | POA: Diagnosis not present

## 2021-08-01 DIAGNOSIS — M109 Gout, unspecified: Secondary | ICD-10-CM | POA: Diagnosis not present

## 2021-08-01 DIAGNOSIS — E1169 Type 2 diabetes mellitus with other specified complication: Secondary | ICD-10-CM | POA: Diagnosis not present

## 2021-08-01 DIAGNOSIS — I10 Essential (primary) hypertension: Secondary | ICD-10-CM | POA: Diagnosis not present

## 2021-08-01 DIAGNOSIS — K219 Gastro-esophageal reflux disease without esophagitis: Secondary | ICD-10-CM | POA: Diagnosis not present

## 2021-08-01 DIAGNOSIS — Z23 Encounter for immunization: Secondary | ICD-10-CM | POA: Diagnosis not present

## 2021-10-04 DIAGNOSIS — R197 Diarrhea, unspecified: Secondary | ICD-10-CM | POA: Diagnosis not present

## 2021-10-04 DIAGNOSIS — R140 Abdominal distension (gaseous): Secondary | ICD-10-CM | POA: Diagnosis not present

## 2021-10-07 DIAGNOSIS — R197 Diarrhea, unspecified: Secondary | ICD-10-CM | POA: Diagnosis not present

## 2021-10-07 DIAGNOSIS — R140 Abdominal distension (gaseous): Secondary | ICD-10-CM | POA: Diagnosis not present

## 2021-10-15 DIAGNOSIS — K648 Other hemorrhoids: Secondary | ICD-10-CM | POA: Diagnosis not present

## 2021-10-15 DIAGNOSIS — K591 Functional diarrhea: Secondary | ICD-10-CM | POA: Diagnosis not present

## 2021-11-08 DIAGNOSIS — K6389 Other specified diseases of intestine: Secondary | ICD-10-CM | POA: Diagnosis not present

## 2021-11-08 DIAGNOSIS — E119 Type 2 diabetes mellitus without complications: Secondary | ICD-10-CM | POA: Diagnosis not present

## 2021-11-08 DIAGNOSIS — R1084 Generalized abdominal pain: Secondary | ICD-10-CM | POA: Diagnosis not present

## 2021-11-08 DIAGNOSIS — R142 Eructation: Secondary | ICD-10-CM | POA: Diagnosis not present

## 2021-11-08 DIAGNOSIS — R140 Abdominal distension (gaseous): Secondary | ICD-10-CM | POA: Diagnosis not present

## 2021-11-08 DIAGNOSIS — Z79899 Other long term (current) drug therapy: Secondary | ICD-10-CM | POA: Diagnosis not present

## 2021-11-08 DIAGNOSIS — R197 Diarrhea, unspecified: Secondary | ICD-10-CM | POA: Diagnosis not present

## 2021-11-08 DIAGNOSIS — R109 Unspecified abdominal pain: Secondary | ICD-10-CM | POA: Diagnosis not present

## 2021-11-15 DIAGNOSIS — R140 Abdominal distension (gaseous): Secondary | ICD-10-CM | POA: Diagnosis not present

## 2021-11-15 DIAGNOSIS — K5900 Constipation, unspecified: Secondary | ICD-10-CM | POA: Diagnosis not present

## 2022-01-31 DIAGNOSIS — E113291 Type 2 diabetes mellitus with mild nonproliferative diabetic retinopathy without macular edema, right eye: Secondary | ICD-10-CM | POA: Diagnosis not present

## 2022-01-31 DIAGNOSIS — E1169 Type 2 diabetes mellitus with other specified complication: Secondary | ICD-10-CM | POA: Diagnosis not present

## 2022-01-31 DIAGNOSIS — I10 Essential (primary) hypertension: Secondary | ICD-10-CM | POA: Diagnosis not present

## 2022-02-06 DIAGNOSIS — Z23 Encounter for immunization: Secondary | ICD-10-CM | POA: Diagnosis not present

## 2022-02-07 DIAGNOSIS — E119 Type 2 diabetes mellitus without complications: Secondary | ICD-10-CM | POA: Diagnosis not present

## 2022-04-28 ENCOUNTER — Other Ambulatory Visit: Payer: Self-pay | Admitting: Physician Assistant

## 2022-04-28 DIAGNOSIS — Z1231 Encounter for screening mammogram for malignant neoplasm of breast: Secondary | ICD-10-CM

## 2022-05-16 DIAGNOSIS — H40021 Open angle with borderline findings, high risk, right eye: Secondary | ICD-10-CM | POA: Diagnosis not present

## 2022-06-20 ENCOUNTER — Ambulatory Visit
Admission: RE | Admit: 2022-06-20 | Discharge: 2022-06-20 | Disposition: A | Discharge status: Home / Self Care | Payer: Federal, State, Local not specified - PPO | Source: Ambulatory Visit | Attending: Physician Assistant | Admitting: Physician Assistant

## 2022-06-20 DIAGNOSIS — Z1231 Encounter for screening mammogram for malignant neoplasm of breast: Secondary | ICD-10-CM

## 2022-06-23 ENCOUNTER — Encounter: Payer: Self-pay | Admitting: Internal Medicine

## 2022-06-23 ENCOUNTER — Ambulatory Visit: Payer: Federal, State, Local not specified - PPO | Attending: Internal Medicine | Admitting: Internal Medicine

## 2022-06-23 VITALS — BP 128/78 | HR 71 | Ht 66.0 in | Wt 254.4 lb

## 2022-06-23 DIAGNOSIS — I10 Essential (primary) hypertension: Secondary | ICD-10-CM | POA: Diagnosis not present

## 2022-06-23 DIAGNOSIS — E785 Hyperlipidemia, unspecified: Secondary | ICD-10-CM

## 2022-06-23 DIAGNOSIS — M791 Myalgia, unspecified site: Secondary | ICD-10-CM | POA: Diagnosis not present

## 2022-06-23 DIAGNOSIS — T466X5A Adverse effect of antihyperlipidemic and antiarteriosclerotic drugs, initial encounter: Secondary | ICD-10-CM

## 2022-06-23 DIAGNOSIS — E119 Type 2 diabetes mellitus without complications: Secondary | ICD-10-CM | POA: Diagnosis not present

## 2022-06-23 MED ORDER — EZETIMIBE 10 MG PO TABS: 10.0000 mg | ORAL_TABLET | Freq: Every day | ORAL | 3 refills | Status: AC

## 2022-06-23 NOTE — Progress Notes (Signed)
LIPID CLINIC CONSULT NOTE  Chief Complaint:  Manage dyslipidemia  Primary Care Physician: Lennie Odor, PA  Primary Cardiologist:  None  HPI:  Brittany Huerta is a 62 y.o. female who is being seen today for the evaluation of dyslipidemia at the request of [place], [place], [place].  This is a pleasant 62 year old female kindly referred for evaluation management of dyslipidemia.  She has a history of diabetes, hypertension and dyslipidemia as well as family history of diabetes and MI in her father who died of surgical complications related to coronary artery disease in his 5s.  She has primarily had triglyceride elevations.  Her most recent lipid profile was on May 2023 that I had access to which showed total cholesterol 240, HDL 57, LDL 107 and triglycerides 450.  Historically based on labs that were sent with her referral, her cholesterol has run in about the 250 total range with triglycerides in the 2-300s and LDL around 150-160.  She unfortunately cannot tolerate statins.  She had previously had significant myalgias on atorvastatin, rosuvastatin, Lescol and WelChol.  She has had some issues with diabetes and recently had an A1c of 10.9 in November 2023 however now on therapy with some dietary changes her A1c is 7.1%.  She has no known coronary disease.  She said she had a workup in [place] through [facility] in the past but those records are not immediately available.  Diet is variable but she tries to limit sugars primarily.  PMHx:  Past Medical History:  Diagnosis Date   Allergic rhinitis    Arthritis    Complication of anesthesia    Diabetes    Type II   GERD (gastroesophageal reflux disease)    Gout    Hypertension    Nasopharyngeal mass    PONV (postoperative nausea and vomiting)    Recurrent epistaxis     Past Surgical History:  Procedure Laterality Date   ABDOMINAL HYSTERECTOMY     CERVICAL BIOPSY     NASAL ENDOSCOPY N/A 02/20/2017   Procedure: ENDOSCOPIC NASAL  NASAL CAUTERY;  Surgeon: Helayne Seminole, MD;  Location: [facility] OR;  Service: ENT;  Laterality: N/A;   SINOSCOPY      FAMHx:  Family History  Problem Relation Age of Onset   Breast cancer Mother    Allergic rhinitis Mother    Hypertension Mother    Hypertension Father    Diabetes Father    Allergic rhinitis Brother     SOCHx:   reports that she has never smoked. She has never used smokeless tobacco. She reports that she does not drink alcohol and does not use drugs.  ALLERGIES:  Allergies  Allergen Reactions   Welchol [Colesevelam Hcl] Shortness Of Breath   Atorvastatin Other (See Comments)    MYALGIAS   Penicillins Itching, Nausea Only and Swelling    INTOLERANCE >  NAUSEA SWELLING REACTION UNSPECIFIED  Has patient had a PCN reaction causing immediate rash, facial/tongue/throat swelling, SOB or lightheadedness with hypotension:  #  #  #  NO  #  #  #  Has patient had a PCN reaction causing severe rash involving mucus membranes or skin necrosis:  #  #  #  NO  #  #  #  Has patient had a PCN reaction that required hospitalization:  #  #  #  NO  #  #  #  Has patient had a PCN reaction occurring within the last 10 years:  #  #  #  NO  #  #  #     Ace Inhibitors Swelling    SWELLING REACTION UNSPECIFIED    Diuretic [Buchu-Cornsilk-Ch Grass-Hydran] Swelling    UNSPECIFIED REACTION    Hctz [Hydrochlorothiazide] Swelling    UNSPECIFIED REACTION    Angiotensin Receptor Blockers Swelling and Rash    SWELLING REACTION UNSPECIFIED     ROS: Pertinent items noted in HPI and remainder of comprehensive ROS otherwise negative.  HOME MEDS: Current Outpatient Medications on File Prior to Visit  Medication Sig Dispense Refill   acetaminophen (TYLENOL 8 HOUR ARTHRITIS PAIN) 650 MG CR tablet Take 650 mg by mouth every 8 (eight) hours as needed for pain.     atenolol (TENORMIN) 50 MG tablet Take 50 mg by mouth 2 (two) times daily.     cloNIDine (CATAPRES) 0.3 MG tablet Take 0.3 mg by mouth  2 (two) times daily.  1   CONTOUR NEXT TEST test strip 1 each by Other route once a week.      diclofenac (VOLTAREN) 50 MG EC tablet Take 50 mg by mouth 2 (two) times daily as needed (FOR PAIN/INFLAMMATION.).      diphenhydrAMINE (BENADRYL) 25 mg capsule Take 25 mg by mouth every 8 (eight) hours as needed (for allergies.).      doxazosin (CARDURA) 8 MG tablet Take 8 mg by mouth 2 (two) times daily.     famotidine (PEPCID) 20 MG tablet Take one tablet twice daily 60 tablet 0   FARXIGA 5 MG TABS tablet Take 5 mg by mouth at bedtime.      levocetirizine (XYZAL) 5 MG tablet Take 5 mg by mouth at bedtime.      montelukast (SINGULAIR) 10 MG tablet Take 10 mg by mouth at bedtime.      torsemide (DEMADEX) 20 MG tablet Take 20 mg by mouth 2 (two) times daily.     ULORIC 40 MG tablet Take 40 mg by mouth at bedtime.      metFORMIN (GLUCOPHAGE) 1000 MG tablet Take 1,000 mg by mouth 2 (two) times daily.     omeprazole (PRILOSEC) 20 MG capsule Take 1 capsule (20 mg total) by mouth daily. (Patient taking differently: Take 20 mg by mouth daily before breakfast. ) 30 capsule 5   omeprazole (PRILOSEC) 40 MG capsule TAKE 1 CAPSULE EVERY MORNING BEFORE BREAKFAST 30 capsule 0   ranitidine (ZANTAC) 150 MG tablet Take one tablet twice daily as directed 60 tablet 5   ranitidine (ZANTAC) 300 MG tablet Take 1 tablet (300 mg total) by mouth at bedtime. Take one tablet daily at bedtime 30 tablet 5   No current facility-administered medications on file prior to visit.    LABS/IMAGING: No results found for this or any previous visit (from the past 48 hour(s)). No results found.  LIPID PANEL: No results found for: "CHOL", "TRIG", "HDL", "CHOLHDL", "VLDL", "LDLCALC", "LDLDIRECT"  WEIGHTS: Wt Readings from Last 3 Encounters:  06/23/22 254 lb 6.4 oz (115.4 kg)  02/18/17 247 lb 6.4 oz (112.2 kg)  12/12/16 239 lb (108.4 kg)    VITALS: BP 128/78 (Patient Position: Sitting, Cuff Size: Large)   Pulse 71   Ht 5\' 6"   (1.676 m)   Wt 254 lb 6.4 oz (115.4 kg)   SpO2 99%   BMI 41.06 kg/m   EXAM: Deferred   EKG: Deferred  ASSESSMENT: Mixed dyslipidemia, goal LDL less than 70 Multi-statin intolerant-myalgias Type 2 diabetes-A1c 7.1% Hypertension Family history of early onset coronary artery disease  PLAN: 1.  Brittany Huerta has a mixed dyslipidemia and a target LDL less than 70.  She has no known coronary disease however has risk factors of diabetes and has not been tolerant of statins and WelChol.  We discussed some treatment options and I felt that we would be best served by starting with ezetimibe.  This should help both with her triglycerides and LDL.  Will also obtain a calcium score to assess for coronary artery disease.  I expect that she may need further therapy, possibly PCSK9 inhibitor or addition of a prescription omega-3.  Plan follow-up with repeat lipid NMR and LP(a) in about 3 months and I will contact her with the results of her calcium score.  Thanks as always for the kind referral.  Pixie Casino, MD, FACC, [place] Director of the Advanced Lipid Disorders &  Cardiovascular Risk Reduction Clinic Diplomate of the American Board of Clinical Lipidology Attending Cardiologist  Direct Dial: [phone]  Fax: [phone]  Website:  [email or web address] 06/23/2022, 8:58 AM

## 2022-06-23 NOTE — Patient Instructions (Addendum)
Medication Instructions:  START zetia 10mg  daily once daily   *If you need a refill on your cardiac medications before your next appointment, please call your pharmacy*   Lab Work: FASTING lab work to check cholesterol in 3-4 months ** complete about one week before your next visit  If you have labs (blood work) drawn today and your tests are completely normal, you will receive your results only by: [place] (if you have MyChart) OR A paper copy in the mail If you have any lab test that is abnormal or we need to change your treatment, we will call you to review the results.   Testing/Procedures: Dr. Debara Pickett has ordered a CT coronary calcium score.   Test locations:  [place]   This is $99 out of pocket.   Coronary CalciumScan A coronary calcium scan is an imaging test used to look for deposits of calcium and other fatty materials (plaques) in the inner lining of the blood vessels of the heart (coronary arteries). These deposits of calcium and plaques can partly clog and narrow the coronary arteries without producing any symptoms or warning signs. This puts a person at risk for a heart attack. This test can detect these deposits before symptoms develop. Tell a health care provider about: Any allergies you have. All medicines you are taking, including vitamins, herbs, eye drops, creams, and over-the-counter medicines. Any problems you or family members have had with anesthetic medicines. Any blood disorders you have. Any surgeries you have had. Any medical conditions you have. Whether you are pregnant or may be pregnant. What are the risks? Generally, this is a safe procedure. However, problems may occur, including: Harm to a pregnant woman and her unborn baby. This test involves the use of radiation. Radiation exposure can be dangerous to a pregnant woman and her unborn baby. If you are pregnant, you generally should not have this procedure  done. Slight increase in the risk of cancer. This is because of the radiation involved in the test. What happens before the procedure? No preparation is needed for this procedure. What happens during the procedure? You will undress and remove any jewelry around your neck or chest. You will put on a hospital gown. Sticky electrodes will be placed on your chest. The electrodes will be connected to an electrocardiogram (ECG) machine to record a tracing of the electrical activity of your heart. A CT scanner will take pictures of your heart. During this time, you will be asked to lie still and hold your breath for 2-3 seconds while a picture of your heart is being taken. The procedure may vary among health care providers and hospitals. What happens after the procedure? You can get dressed. You can return to your normal activities. It is up to you to get the results of your test. Ask your health care provider, or the department that is doing the test, when your results will be ready. Summary A coronary calcium scan is an imaging test used to look for deposits of calcium and other fatty materials (plaques) in the inner lining of the blood vessels of the heart (coronary arteries). Generally, this is a safe procedure. Tell your health care provider if you are pregnant or may be pregnant. No preparation is needed for this procedure. A CT scanner will take pictures of your heart. You can return to your normal activities after the scan is done. This information is not intended to replace advice given to you by your health  care provider. Make sure you discuss any questions you have with your health care provider. Document Released: 09/06/2007 Document Revised: 01/28/2016 Document Reviewed: 01/28/2016 Elsevier Interactive Patient Education  2017 [place]: At [facility], you and your health needs are our priority.  As part of our continuing mission to provide you with  exceptional heart care, we have created designated Provider Care Teams.  These Care Teams include your primary Cardiologist (physician) and Advanced Practice Providers (APPs -  Physician Assistants and Nurse Practitioners) who all work together to provide you with the care you need, when you need it.  We recommend signing up for the patient portal called "MyChart".  Sign up information is provided on this After Visit Summary.  MyChart is used to connect with patients for Virtual Visits (Telemedicine).  Patients are able to view lab/test results, encounter notes, upcoming appointments, etc.  Non-urgent messages can be sent to your provider as well.   To learn more about what you can do with MyChart, go to [email or web address].    Your next appointment:    3-4 months with Dr. Debara Pickett

## 2022-06-27 ENCOUNTER — Ambulatory Visit (HOSPITAL_BASED_OUTPATIENT_CLINIC_OR_DEPARTMENT_OTHER)
Admission: RE | Admit: 2022-06-27 | Discharge: 2022-06-27 | Disposition: A | Discharge status: Home / Self Care | Payer: Self-pay | Source: Ambulatory Visit | Attending: Internal Medicine | Admitting: Internal Medicine

## 2022-06-27 DIAGNOSIS — E785 Hyperlipidemia, unspecified: Secondary | ICD-10-CM | POA: Insufficient documentation

## 2022-09-05 DIAGNOSIS — I10 Essential (primary) hypertension: Secondary | ICD-10-CM | POA: Diagnosis not present

## 2022-09-05 DIAGNOSIS — E7800 Pure hypercholesterolemia, unspecified: Secondary | ICD-10-CM | POA: Diagnosis not present

## 2022-09-05 DIAGNOSIS — E1169 Type 2 diabetes mellitus with other specified complication: Secondary | ICD-10-CM | POA: Diagnosis not present

## 2022-09-05 DIAGNOSIS — M109 Gout, unspecified: Secondary | ICD-10-CM | POA: Diagnosis not present

## 2022-09-05 DIAGNOSIS — Z0000 Encounter for general adult medical examination without abnormal findings: Secondary | ICD-10-CM | POA: Diagnosis not present

## 2022-10-20 DIAGNOSIS — E785 Hyperlipidemia, unspecified: Secondary | ICD-10-CM | POA: Diagnosis not present

## 2022-10-24 ENCOUNTER — Telehealth: Payer: Self-pay

## 2022-10-24 ENCOUNTER — Ambulatory Visit: Payer: Federal, State, Local not specified - PPO | Attending: Internal Medicine | Admitting: Internal Medicine

## 2022-10-24 ENCOUNTER — Encounter: Payer: Self-pay | Admitting: Internal Medicine

## 2022-10-24 ENCOUNTER — Other Ambulatory Visit (HOSPITAL_COMMUNITY): Payer: Self-pay

## 2022-10-24 VITALS — BP 138/72 | HR 82 | Ht 66.0 in | Wt 249.4 lb

## 2022-10-24 DIAGNOSIS — E785 Hyperlipidemia, unspecified: Secondary | ICD-10-CM | POA: Diagnosis not present

## 2022-10-24 DIAGNOSIS — R931 Abnormal findings on diagnostic imaging of heart and coronary circulation: Secondary | ICD-10-CM | POA: Diagnosis not present

## 2022-10-24 DIAGNOSIS — E119 Type 2 diabetes mellitus without complications: Secondary | ICD-10-CM

## 2022-10-24 DIAGNOSIS — M791 Myalgia, unspecified site: Secondary | ICD-10-CM | POA: Diagnosis not present

## 2022-10-24 DIAGNOSIS — T466X5D Adverse effect of antihyperlipidemic and antiarteriosclerotic drugs, subsequent encounter: Secondary | ICD-10-CM

## 2022-10-24 NOTE — Telephone Encounter (Signed)
Pharmacy Patient Advocate Encounter   Received notification from Physician's Office that prior authorization for REPATHA is required/requested.   Insurance verification completed.   The patient is insured through [facility] .   Per test claim: PA required; PA submitted to [facility] via CoverMyMeds Key/confirmation #/[id number] Status is pending

## 2022-10-24 NOTE — Patient Instructions (Signed)
Medication Instructions:  Dr. Rennis Golden recommends Repatha Sureclick 140mg /mL (PCSK9). This is an injectable cholesterol medication self-administered once every 14 days. This medication will likely need prior approval with your insurance company, which we will work on. If the medication is not approved initially, we may need to do an appeal with your insurance.   Administer medication in area of fatty tissue such as abdomen, outer thigh, back of upper arm - and rotate site with each injection Store medication in refrigerator until ready to administer - allow to sit at room temp for 30 mins - 1 hour prior to injection Dispose of medication in a SHARPS container - your pharmacy should be able to direct you on this and proper disposal   If you need a co-pay card for Repatha: Lawsponsor.fr If you need a co-pay card for Praluent: [email or web address]  Patient Assistance:    These foundations have funds at various times.   The PAN Foundation: https://www.panfoundation.org/disease-funds/hypercholesterolemia/ -- can sign up for wait list  The [employer] offers assistance to help pay for medication copays.  They will cover copays for all cholesterol lowering meds, including statins, fibrates, omega-3 fish oils like Vascepa, ezetimibe, Repatha, Praluent, Nexletol, Nexlizet.  The cards are usually good for $2,500 or 12 months, whichever comes first. Our fax # is [phone] (you will need this to apply) Go to healthwellfoundation.org Click on "Apply Now" Answer questions as to whom is applying (patient or representative) Your disease fund will be "hypercholesterolemia - Medicare access" They will ask questions about finances and which medications you are taking for cholesterol When you submit, the approval is usually within minutes.  You will need to print the card information from the site You will need to show this information to your pharmacy,  they will bill your Medicare Part D plan first -then bill Health Well --for the copay.   You can also call them at [phone], although the hold times can be quite long.     *If you need a refill on your cardiac medications before your next appointment, please call your pharmacy*   Lab Work: FASTING lab work to check cholesterol in 3-4 months  If you have labs (blood work) drawn today and your tests are completely normal, you will receive your results only by: MyChart Message (if you have MyChart) OR A paper copy in the mail If you have any lab test that is abnormal or we need to change your treatment, we will call you to review the results.    Follow-Up: At [facility], you and your health needs are our priority.  As part of our continuing mission to provide you with exceptional heart care, we have created designated Provider Care Teams.  These Care Teams include your primary Cardiologist (physician) and Advanced Practice Providers (APPs -  Physician Assistants and Nurse Practitioners) who all work together to provide you with the care you need, when you need it.  We recommend signing up for the patient portal called "MyChart".  Sign up information is provided on this After Visit Summary.  MyChart is used to connect with patients for Virtual Visits (Telemedicine).  Patients are able to view lab/test results, encounter notes, upcoming appointments, etc.  Non-urgent messages can be sent to your provider as well.   To learn more about what you can do with MyChart, go to [email or web address].    Your next appointment:    3-4 months with Dr. Rennis Golden

## 2022-10-24 NOTE — Progress Notes (Unsigned)
LIPID CLINIC CONSULT NOTE  Chief Complaint:  Manage dyslipidemia  Primary Care Physician: Milus Height, PA  Primary Cardiologist:  None  HPI:  Brittany Huerta is a 62 y.o. female who is being seen today for the evaluation of dyslipidemia at the request of [place], [place], [place].  This is a pleasant 62 year old female kindly referred for evaluation management of dyslipidemia.  She has a history of diabetes, hypertension and dyslipidemia as well as family history of diabetes and MI in her father who died of surgical complications related to coronary artery disease in his 2s.  She has primarily had triglyceride elevations.  Her most recent lipid profile was on May 2023 that I had access to which showed total cholesterol 240, HDL 57, LDL 107 and triglycerides 409.  Historically based on labs that were sent with her referral, her cholesterol has run in about the 250 total range with triglycerides in the 2-300s and LDL around 150-160.  She unfortunately cannot tolerate statins.  She had previously had significant myalgias on atorvastatin, rosuvastatin, Lescol and WelChol.  She has had some issues with diabetes and recently had an A1c of 10.9 in November 2023 however now on therapy with some dietary changes her A1c is 7.1%.  She has no known coronary disease.  She said she had a workup in [place] through [facility] in the past but those records are not immediately available.  Diet is variable but she tries to limit sugars primarily.  PMHx:  Past Medical History:  Diagnosis Date   Allergic rhinitis    Arthritis    Complication of anesthesia    Diabetes (HCC)    Type II   GERD (gastroesophageal reflux disease)    Gout    Hypertension    Nasopharyngeal mass    PONV (postoperative nausea and vomiting)    Recurrent epistaxis     Past Surgical History:  Procedure Laterality Date   ABDOMINAL HYSTERECTOMY     CERVICAL BIOPSY     NASAL ENDOSCOPY N/A 02/20/2017   Procedure: ENDOSCOPIC  NASAL NASAL CAUTERY;  Surgeon: Graylin Shiver, MD;  Location: [facility] OR;  Service: ENT;  Laterality: N/A;   SINOSCOPY      FAMHx:  Family History  Problem Relation Age of Onset   Breast cancer Mother    Allergic rhinitis Mother    Hypertension Mother    Hypertension Father    Diabetes Father    Allergic rhinitis Brother     SOCHx:   reports that she has never smoked. She has never used smokeless tobacco. She reports that she does not drink alcohol and does not use drugs.  ALLERGIES:  Allergies  Allergen Reactions   Welchol [Colesevelam Hcl] Shortness Of Breath   Atorvastatin Other (See Comments)    MYALGIAS   Penicillins Itching, Nausea Only and Swelling    INTOLERANCE >  NAUSEA SWELLING REACTION UNSPECIFIED  Has patient had a PCN reaction causing immediate rash, facial/tongue/throat swelling, SOB or lightheadedness with hypotension:  #  #  #  NO  #  #  #  Has patient had a PCN reaction causing severe rash involving mucus membranes or skin necrosis:  #  #  #  NO  #  #  #  Has patient had a PCN reaction that required hospitalization:  #  #  #  NO  #  #  #  Has patient had a PCN reaction occurring within the last 10 years:  #  #  #  NO  #  #  #     Ace Inhibitors Swelling    SWELLING REACTION UNSPECIFIED    Diuretic [Buchu-Cornsilk-Ch Grass-Hydran] Swelling    UNSPECIFIED REACTION    Hctz [Hydrochlorothiazide] Swelling    UNSPECIFIED REACTION    Angiotensin Receptor Blockers Swelling and Rash    SWELLING REACTION UNSPECIFIED     ROS: Pertinent items noted in HPI and remainder of comprehensive ROS otherwise negative.  HOME MEDS: Current Outpatient Medications on File Prior to Visit  Medication Sig Dispense Refill   acetaminophen (TYLENOL 8 HOUR ARTHRITIS PAIN) 650 MG CR tablet Take 650 mg by mouth every 8 (eight) hours as needed for pain.     atenolol (TENORMIN) 50 MG tablet Take 50 mg by mouth 2 (two) times daily.     cloNIDine (CATAPRES) 0.3 MG tablet Take 0.3 mg by  mouth 2 (two) times daily.  1   CONTOUR NEXT TEST test strip 1 each by Other route once a week.      diphenhydrAMINE (BENADRYL) 25 mg capsule Take 25 mg by mouth every 8 (eight) hours as needed (for allergies.).      doxazosin (CARDURA) 8 MG tablet Take 8 mg by mouth 2 (two) times daily.     ezetimibe (ZETIA) 10 MG tablet Take 1 tablet (10 mg total) by mouth daily. 90 tablet 3   FARXIGA 5 MG TABS tablet Take 5 mg by mouth at bedtime.      levocetirizine (XYZAL) 5 MG tablet Take 5 mg by mouth at bedtime.      montelukast (SINGULAIR) 10 MG tablet Take 10 mg by mouth at bedtime.      torsemide (DEMADEX) 20 MG tablet Take 20 mg by mouth 2 (two) times daily.     ULORIC 40 MG tablet Take 40 mg by mouth at bedtime.      diclofenac (VOLTAREN) 50 MG EC tablet Take 50 mg by mouth 2 (two) times daily as needed (FOR PAIN/INFLAMMATION.).      famotidine (PEPCID) 20 MG tablet Take one tablet twice daily 60 tablet 0   metFORMIN (GLUCOPHAGE) 1000 MG tablet Take 1,000 mg by mouth 2 (two) times daily.     omeprazole (PRILOSEC) 20 MG capsule Take 1 capsule (20 mg total) by mouth daily. (Patient taking differently: Take 20 mg by mouth daily before breakfast. ) 30 capsule 5   omeprazole (PRILOSEC) 40 MG capsule TAKE 1 CAPSULE EVERY MORNING BEFORE BREAKFAST 30 capsule 0   ranitidine (ZANTAC) 150 MG tablet Take one tablet twice daily as directed 60 tablet 5   ranitidine (ZANTAC) 300 MG tablet Take 1 tablet (300 mg total) by mouth at bedtime. Take one tablet daily at bedtime 30 tablet 5   No current facility-administered medications on file prior to visit.    LABS/IMAGING: No results found for this or any previous visit (from the past 48 hour(s)). No results found.  LIPID PANEL: No results found for: "CHOL", "TRIG", "HDL", "CHOLHDL", "VLDL", "LDLCALC", "LDLDIRECT"  WEIGHTS: Wt Readings from Last 3 Encounters:  10/24/22 249 lb 6.4 oz (113.1 kg)  06/23/22 254 lb 6.4 oz (115.4 kg)  02/18/17 247 lb 6.4 oz (112.2  kg)    VITALS: BP 138/72 (BP Location: Left Arm, Patient Position: Sitting, Cuff Size: Large)   Pulse 82   Ht 5\' 6"  (1.676 m)   Wt 249 lb 6.4 oz (113.1 kg)   SpO2 96%   BMI 40.25 kg/m   EXAM: Deferred   EKG: Deferred  ASSESSMENT:  Mixed dyslipidemia, goal LDL less than 70 Multi-statin intolerant-myalgias Type 2 diabetes-A1c 7.1% Hypertension Family history of early onset coronary artery disease  PLAN: 1.   Ms. Dunton has a mixed dyslipidemia and a target LDL less than 70.  She has no known coronary disease however has risk factors of diabetes and has not been tolerant of statins and WelChol.  We discussed some treatment options and I felt that we would be best served by starting with ezetimibe.  This should help both with her triglycerides and LDL.  Will also obtain a calcium score to assess for coronary artery disease.  I expect that she may need further therapy, possibly PCSK9 inhibitor or addition of a prescription omega-3.  Plan follow-up with repeat lipid NMR and LP(a) in about 3 months and I will contact her with the results of her calcium score.  Thanks as always for the kind referral.  Chrystie Nose, MD, Milagros Loll  [place]  [facility]  Medical Director of the Advanced Lipid Disorders &  Cardiovascular Risk Reduction Clinic Diplomate of the American Board of Clinical Lipidology Attending Cardiologist  Direct Dial: [phone]  Fax: [phone]  Website:  [email or web address]  Lisette Abu Callaway Hardigree 10/24/2022, 3:21 PM

## 2022-10-27 ENCOUNTER — Other Ambulatory Visit (HOSPITAL_COMMUNITY): Payer: Self-pay

## 2022-10-30 ENCOUNTER — Other Ambulatory Visit (HOSPITAL_COMMUNITY): Payer: Self-pay

## 2022-10-30 NOTE — Telephone Encounter (Signed)
Pharmacy Patient Advocate Encounter  Received notification from [facility] that Prior Authorization for Repatha has been APPROVED from 09/27/2022 to 10/27/2023. Ran test claim, Copay is $24.99. This test claim was processed through [facility]- copay amounts may vary at other pharmacies due to pharmacy/plan contracts, or as the patient moves through the different stages of their insurance plan.   PA #/Case ID/Reference #: N/A

## 2022-10-30 NOTE — Telephone Encounter (Signed)
Update sent to patient via MyChart 

## 2022-10-31 MED ORDER — REPATHA SURECLICK 140 MG/ML ~~LOC~~ SOAJ: 140.0000 mg | SUBCUTANEOUS | 3 refills | Status: DC

## 2022-10-31 NOTE — Addendum Note (Signed)
Addended by: Lindell Spar on: 10/31/2022 07:50 AM   Modules accepted: Orders

## 2022-10-31 NOTE — Telephone Encounter (Signed)
Rx(s) sent to pharmacy electronically.  

## 2022-11-26 DIAGNOSIS — E1169 Type 2 diabetes mellitus with other specified complication: Secondary | ICD-10-CM | POA: Diagnosis not present

## 2022-12-12 DIAGNOSIS — L729 Follicular cyst of the skin and subcutaneous tissue, unspecified: Secondary | ICD-10-CM | POA: Diagnosis not present

## 2022-12-12 DIAGNOSIS — E1169 Type 2 diabetes mellitus with other specified complication: Secondary | ICD-10-CM | POA: Diagnosis not present

## 2023-01-19 DIAGNOSIS — M170 Bilateral primary osteoarthritis of knee: Secondary | ICD-10-CM | POA: Diagnosis not present

## 2023-01-23 DIAGNOSIS — E785 Hyperlipidemia, unspecified: Secondary | ICD-10-CM | POA: Diagnosis not present

## 2023-01-26 LAB — NMR, LIPOPROFILE
Cholesterol, Total: 164 mg/dL (ref 100–199)
HDL Particle Number: 46.6 umol/L (ref >=30.5)
HDL-C: 69 mg/dL (ref >39)
LDL Particle Number: 1113 nmol/L — ABNORMAL HIGH (ref <1000)
LDL Size: 20.8 nmol (ref >20.5)
LDL-C (NIH Calc): 75 mg/dL (ref 0–99)
LP-IR Score: 74 — ABNORMAL HIGH (ref <=45)
Small LDL Particle Number: 425 nmol/L (ref <=527)
Triglycerides: 112 mg/dL (ref 0–149)

## 2023-01-26 LAB — LIPOPROTEIN A (LPA): Lipoprotein (a): 75.5 nmol/L — ABNORMAL HIGH (ref <75.0)

## 2023-01-30 ENCOUNTER — Encounter: Payer: Self-pay | Admitting: Internal Medicine

## 2023-01-30 ENCOUNTER — Ambulatory Visit: Payer: Federal, State, Local not specified - PPO | Attending: Internal Medicine | Admitting: Internal Medicine

## 2023-01-30 VITALS — BP 128/68 | HR 73 | Ht 66.0 in | Wt 263.8 lb

## 2023-01-30 DIAGNOSIS — T466X5D Adverse effect of antihyperlipidemic and antiarteriosclerotic drugs, subsequent encounter: Secondary | ICD-10-CM

## 2023-01-30 DIAGNOSIS — M791 Myalgia, unspecified site: Secondary | ICD-10-CM | POA: Diagnosis not present

## 2023-01-30 DIAGNOSIS — R931 Abnormal findings on diagnostic imaging of heart and coronary circulation: Secondary | ICD-10-CM

## 2023-01-30 DIAGNOSIS — T466X5A Adverse effect of antihyperlipidemic and antiarteriosclerotic drugs, initial encounter: Secondary | ICD-10-CM

## 2023-01-30 DIAGNOSIS — E785 Hyperlipidemia, unspecified: Secondary | ICD-10-CM | POA: Diagnosis not present

## 2023-01-30 DIAGNOSIS — E119 Type 2 diabetes mellitus without complications: Secondary | ICD-10-CM | POA: Diagnosis not present

## 2023-01-30 NOTE — Patient Instructions (Addendum)
Medication Instructions:  No changes  Continue taking medications *If you need a refill on your cardiac medications before your next appointment, please call your pharmacy*   Lab Work: NMR in one year  If you have labs (blood work) drawn today and your tests are completely normal, you will receive your results only by: MyChart Message (if you have MyChart) OR A paper copy in the mail If you have any lab test that is abnormal or we need to change your treatment, we will call you to review the results.   Testing/Procedures: None   Follow-Up: At [facility], you and your health needs are our priority.  As part of our continuing mission to provide you with exceptional heart care, we have created designated Provider Care Teams.  These Care Teams include your primary Cardiologist (physician) and Advanced Practice Providers (APPs -  Physician Assistants and Nurse Practitioners) who all work together to provide you with the care you need, when you need it.   Your next appointment:   1 year(s)  Provider:   Dr.Hilty

## 2023-01-30 NOTE — Progress Notes (Unsigned)
LIPID CLINIC CONSULT NOTE  Chief Complaint:  [place] dyslipidemia  Primary Care Physician: Milus Height, PA  Primary Cardiologist:  None  HPI:  Brittany Huerta is a 62 y.o. female who is being seen today for the evaluation of dyslipidemia at the request of [place], [place], [place].  This is a pleasant 62 year old female kindly referred for evaluation management of dyslipidemia.  She has a history of diabetes, hypertension and dyslipidemia as well as family history of diabetes and MI in her father who died of surgical complications related to coronary artery disease in his 37s.  She has primarily had triglyceride elevations.  Her most recent lipid profile was on May 2023 that I had access to which showed total cholesterol 240, HDL 57, LDL 107 and triglycerides 409.  Historically based on labs that were sent with her referral, her cholesterol has run in about the 250 total range with triglycerides in the 2-300s and LDL around 150-160.  She unfortunately cannot tolerate statins.  She had previously had significant myalgias on atorvastatin, rosuvastatin, Lescol and WelChol.  She has had some issues with diabetes and recently had an A1c of 10.9 in November 2023 however now on therapy with some dietary changes her A1c is 7.1%.  She has no known coronary disease.  She said she had a workup in [place] through [facility] in the past but those records are not immediately available.  Diet is variable but she tries to limit sugars primarily.  10/26/2022  [name] returns today for follow-up.  Her last lipid testing unfortunately shows uncontrolled dyslipidemia.  Her LP(a) was elevated at 111 nmol/L.  LDL particle numbers at 2004 and 86 nmol/L.  Her LDL was 183, HDL 54 and triglycerides 174.  She had a coronary calcium score which was elevated 272, 97th percentile for age and sex matched controls.  She has been on ezetimibe therapy.  Unfortunately as mentioned above she cannot tolerate statins.  At this  point I think she is a very good candidate for PCSK9 inhibitor therapy.  At least 50% reduction in her cholesterol.  PMHx:  Past Medical History:  Diagnosis Date   Allergic rhinitis    Arthritis    Complication of anesthesia    Diabetes (HCC)    Type II   GERD (gastroesophageal reflux disease)    Gout    Hypertension    Nasopharyngeal mass    PONV (postoperative nausea and vomiting)    Recurrent epistaxis     Past Surgical History:  Procedure Laterality Date   ABDOMINAL HYSTERECTOMY     CERVICAL BIOPSY     NASAL ENDOSCOPY N/A 02/20/2017   Procedure: ENDOSCOPIC NASAL NASAL CAUTERY;  Surgeon: Graylin Shiver, MD;  Location: [facility] OR;  Service: ENT;  Laterality: N/A;   SINOSCOPY      FAMHx:  Family History  Problem Relation Age of Onset   Breast cancer Mother    Allergic rhinitis Mother    Hypertension Mother    Hypertension Father    Diabetes Father    Allergic rhinitis Brother     SOCHx:   reports that she has never smoked. She has never used smokeless tobacco. She reports that she does not drink alcohol and does not use drugs.  ALLERGIES:  Allergies  Allergen Reactions   Welchol [Colesevelam Hcl] Shortness Of Breath   Atorvastatin Other (See Comments)    MYALGIAS   Penicillins Itching, Nausea Only and Swelling    INTOLERANCE >  NAUSEA SWELLING REACTION  UNSPECIFIED  Has patient had a PCN reaction causing immediate rash, facial/tongue/throat swelling, SOB or lightheadedness with hypotension:  #  #  #  NO  #  #  #  Has patient had a PCN reaction causing severe rash involving mucus membranes or skin necrosis:  #  #  #  NO  #  #  #  Has patient had a PCN reaction that required hospitalization:  #  #  #  NO  #  #  #  Has patient had a PCN reaction occurring within the last 10 years:  #  #  #  NO  #  #  #     Ace Inhibitors Swelling    SWELLING REACTION UNSPECIFIED    Diuretic [Buchu-Cornsilk-Ch Grass-Hydran] Swelling    UNSPECIFIED REACTION    Hctz  [Hydrochlorothiazide] Swelling    UNSPECIFIED REACTION    Zetia [Ezetimibe] Swelling   Angiotensin Receptor Blockers Swelling and Rash    SWELLING REACTION UNSPECIFIED     ROS: Pertinent items noted in HPI and remainder of comprehensive ROS otherwise negative.  HOME MEDS: Current Outpatient Medications on File Prior to Visit  Medication Sig Dispense Refill   acetaminophen (TYLENOL 8 HOUR ARTHRITIS PAIN) 650 MG CR tablet Take 650 mg by mouth every 8 (eight) hours as needed for pain.     atenolol (TENORMIN) 50 MG tablet Take 50 mg by mouth 2 (two) times daily.     cloNIDine (CATAPRES) 0.3 MG tablet Take 0.3 mg by mouth 2 (two) times daily.  1   CONTOUR NEXT TEST test strip 1 each by Other route once a week.      diphenhydrAMINE (BENADRYL) 25 mg capsule Take 25 mg by mouth every 8 (eight) hours as needed (for allergies.).      doxazosin (CARDURA) 8 MG tablet Take 8 mg by mouth 2 (two) times daily.     Evolocumab (REPATHA SURECLICK) 140 MG/ML SOAJ Inject 140 mg into the skin every 14 (fourteen) days. 6 mL 3   FARXIGA 5 MG TABS tablet Take 5 mg by mouth at bedtime.      levocetirizine (XYZAL) 5 MG tablet Take 5 mg by mouth at bedtime.      montelukast (SINGULAIR) 10 MG tablet Take 10 mg by mouth at bedtime.      Multiple Vitamins-Minerals (PRESERVISION AREDS) CAPS Take 1 capsule by mouth daily.     torsemide (DEMADEX) 20 MG tablet Take 20 mg by mouth 2 (two) times daily.     TRULICITY 1.5 MG/0.5ML SOAJ Inject 1.5 mg into the skin once a week.     ULORIC 40 MG tablet Take 40 mg by mouth at bedtime.      diclofenac (VOLTAREN) 50 MG EC tablet Take 50 mg by mouth 2 (two) times daily as needed (FOR PAIN/INFLAMMATION.).      ezetimibe (ZETIA) 10 MG tablet Take 1 tablet (10 mg total) by mouth daily. 90 tablet 3   famotidine (PEPCID) 20 MG tablet Take one tablet twice daily 60 tablet 0   metFORMIN (GLUCOPHAGE) 1000 MG tablet Take 1,000 mg by mouth 2 (two) times daily.     Olopatadine HCl (PATADAY)  0.7 % SOLN Place 1 drop into both eyes daily.     omeprazole (PRILOSEC) 20 MG capsule Take 1 capsule (20 mg total) by mouth daily. (Patient taking differently: Take 20 mg by mouth daily before breakfast. ) 30 capsule 5   omeprazole (PRILOSEC) 40 MG capsule TAKE 1 CAPSULE EVERY MORNING  BEFORE BREAKFAST 30 capsule 0   ranitidine (ZANTAC) 150 MG tablet Take one tablet twice daily as directed 60 tablet 5   ranitidine (ZANTAC) 300 MG tablet Take 1 tablet (300 mg total) by mouth at bedtime. Take one tablet daily at bedtime 30 tablet 5   No current facility-administered medications on file prior to visit.    LABS/IMAGING: No results found for this or any previous visit (from the past 48 hour(s)). No results found.  LIPID PANEL: No results found for: "CHOL", "TRIG", "HDL", "CHOLHDL", "VLDL", "LDLCALC", "LDLDIRECT"  WEIGHTS: Wt Readings from Last 3 Encounters:  01/30/23 263 lb 12.8 oz (119.7 kg)  10/24/22 249 lb 6.4 oz (113.1 kg)  06/23/22 254 lb 6.4 oz (115.4 kg)    VITALS: BP 128/68 (BP Location: Right Arm, Patient Position: Sitting, Cuff Size: Normal)   Pulse 73   Ht 5\' 6"  (1.676 m)   Wt 263 lb 12.8 oz (119.7 kg)   SpO2 91%   BMI 42.58 kg/m   EXAM: Deferred   EKG: Deferred  ASSESSMENT: Mixed dyslipidemia, goal LDL less than 70 Elevated LP(a) at 111 nmol/L CAC score of 272, 97th percentile (2024) Multi-statin intolerant-myalgias Type 2 diabetes-A1c 7.1% Hypertension Family history of early onset coronary artery disease  PLAN: 1.   Ms. Thurner still has significantly elevated cholesterol with an elevated LP(a) of 111 nmol/L and LDL that remains at 183.  Her target LDL is less than 70 and she needs additional significant therapy.  She was also noted to have significant age advanced coronary artery calcification.  Based on this I would recommend more aggressive therapy and since she cannot tolerate statins she is a good candidate for PCSK9 inhibitor.  Will proceed with prior  authorization for Repatha and plan repeat lipids and follow-up NMR and LP(a) in about 3 to 4 months.  Chrystie Nose, MD, [facility], FACP  [place]  [facility]  Medical Director of the Advanced Lipid Disorders &  Cardiovascular Risk Reduction Clinic Diplomate of the American Board of Clinical Lipidology Attending Cardiologist  Direct Dial: [phone]  Fax: [phone]  Website:  [email or web address] [email or web address] Woollard 01/30/2023, 9:02 AM

## 2023-02-27 DIAGNOSIS — M170 Bilateral primary osteoarthritis of knee: Secondary | ICD-10-CM | POA: Diagnosis not present

## 2023-04-02 DIAGNOSIS — E1169 Type 2 diabetes mellitus with other specified complication: Secondary | ICD-10-CM | POA: Diagnosis not present

## 2023-04-02 DIAGNOSIS — N1831 Chronic kidney disease, stage 3a: Secondary | ICD-10-CM | POA: Diagnosis not present

## 2023-04-02 DIAGNOSIS — E7800 Pure hypercholesterolemia, unspecified: Secondary | ICD-10-CM | POA: Diagnosis not present

## 2023-04-07 DIAGNOSIS — E119 Type 2 diabetes mellitus without complications: Secondary | ICD-10-CM | POA: Diagnosis not present

## 2023-04-23 ENCOUNTER — Telehealth: Payer: Self-pay | Admitting: Pharmacist

## 2023-04-23 NOTE — Progress Notes (Addendum)
   04/23/2023  Patient ID: Brittany Huerta, female   DOB: Oct 25, 1960, 63 y.o.   MRN: [id number]  [employer] Scheduling:   Tried calling patient today to schedule free DM initial visit based on A1c reading with me as the pharmacist. Left HIPAA compliant voicemail requesting call back at earliest convenience to schedule. If patient returns call to office, you can transfer her to my line at [phone]. Thanks!  Update from 05/13/23:   Patient returned call. Main confusion was regarding what additional assistance I could provide. Advised at last A1c check only her Trulicity was increased to 3mg  weekly. She just started this last week due to insurance issues with the pharmacy. Explained that with my services I would be able to estimate what her A1c would look like based on average BG readings so we could adjust medications sooner rather than every 3 months to see how much lower the A1c dropped. Explained that her Trulicity effectiveness would be shown in 2 weeks with the dose increase and I have patients that increase dose every month until reaching 4.5mg  weekly.  She would prefer to wait until 4/11 lab check for A1c before deciding to schedule and after being on max dose of Trulicity. Advised I will check back in during April to see how she feels about scheduling at that time.    Marlowe Aschoff, PharmD [facility] Phone Number: [phone]

## 2023-05-16 ENCOUNTER — Other Ambulatory Visit: Payer: Self-pay

## 2023-05-16 ENCOUNTER — Ambulatory Visit
Admission: RE | Admit: 2023-05-16 | Discharge: 2023-05-16 | Disposition: A | Discharge status: Home / Self Care | Payer: Federal, State, Local not specified - PPO | Source: Ambulatory Visit | Attending: Family Medicine | Admitting: Family Medicine

## 2023-05-16 VITALS — BP 114/71 | HR 87 | Temp 98.0°F | Resp 16 | Ht 66.0 in | Wt 240.0 lb

## 2023-05-16 DIAGNOSIS — M79605 Pain in left leg: Secondary | ICD-10-CM

## 2023-05-16 DIAGNOSIS — M25552 Pain in left hip: Secondary | ICD-10-CM

## 2023-05-16 MED ORDER — PREDNISONE 10 MG (21) PO TBPK: ORAL_TABLET | ORAL | 0 refills | Status: AC

## 2023-05-16 NOTE — ED Provider Notes (Signed)
 [facility]    CSN: [id number] Arrival date & time: 05/16/23  0847      History   Chief Complaint Chief Complaint  Patient presents with   Leg Pain    Pains shoot up my left leg when walking or standing. Balanced a 32 pound bag of cat food on my thigh in order to open a door. - Entered by patient    HPI Brittany Huerta is a 63 y.o. female.    Leg Pain Pain left hip radiating down her leg, gradual onset 4 days ago, states she was balancing a bag of cat food between her hip and a railing as she opened the door.  Later that day developed the pain, pain is sharp, worse with ambulation and certain positions, 9 out of 10.  Taking Tylenol and meloxicam without relief.  Has nausea today which she attributes to the pain Denies back pain, paresthesias, weakness, swelling, bruising, rashes or skin changes, abdominal pain, urinary symptoms, direct trauma, blows or fall, near falls.  Past medical history includes hypertension, diabetes, high cholesterol, GERD, gout, arthritis   Past Medical History:  Diagnosis Date   Allergic rhinitis    Arthritis    Complication of anesthesia    Diabetes (HCC)    Type II   GERD (gastroesophageal reflux disease)    Gout    Hypertension    Nasopharyngeal mass    PONV (postoperative nausea and vomiting)    Recurrent epistaxis     Patient Active Problem List   Diagnosis Date Noted   LPRD (laryngopharyngeal reflux disease) 03/06/2017   Other allergic rhinitis 03/06/2017   Epistaxis 03/06/2017    Past Surgical History:  Procedure Laterality Date   ABDOMINAL HYSTERECTOMY     CERVICAL BIOPSY     NASAL ENDOSCOPY N/A 02/20/2017   Procedure: ENDOSCOPIC NASAL NASAL CAUTERY;  Surgeon: Graylin Shiver, MD;  Location: [facility] OR;  Service: ENT;  Laterality: N/A;   SINOSCOPY      OB History   No obstetric history on file.      Home Medications    Prior to Admission medications   Medication Sig Start Date End Date Taking? Authorizing  Provider  acetaminophen (TYLENOL 8 HOUR ARTHRITIS PAIN) 650 MG CR tablet Take 650 mg by mouth every 8 (eight) hours as needed for pain.    [provider]  atenolol (TENORMIN) 50 MG tablet Take 50 mg by mouth 2 (two) times daily. 10/20/16   [provider]  cloNIDine (CATAPRES) 0.3 MG tablet Take 0.3 mg by mouth 2 (two) times daily. 10/31/16   [provider]  CONTOUR NEXT TEST test strip 1 each by Other route once a week.  11/12/16   [provider]  diclofenac (VOLTAREN) 50 MG EC tablet Take 50 mg by mouth 2 (two) times daily as needed (FOR PAIN/INFLAMMATION.).  12/11/16   [provider]  diphenhydrAMINE (BENADRYL) 25 mg capsule Take 25 mg by mouth every 8 (eight) hours as needed (for allergies.).     [provider]  doxazosin (CARDURA) 8 MG tablet Take 8 mg by mouth 2 (two) times daily. 10/13/16   [provider]  Evolocumab (REPATHA SURECLICK) 140 MG/ML SOAJ Inject 140 mg into the skin every 14 (fourteen) days. 10/31/22   Hilty, Lisette Abu, MD  ezetimibe (ZETIA) 10 MG tablet Take 1 tablet (10 mg total) by mouth daily. 06/23/22 06/18/23  Chrystie Nose, MD  famotidine (PEPCID) 20 MG tablet Take one tablet twice  daily 04/14/18   Ambs, Norvel Richards, FNP  FARXIGA 5 MG TABS tablet Take 5 mg by mouth at bedtime.  11/27/16   [provider]  levocetirizine (XYZAL) 5 MG tablet Take 5 mg by mouth at bedtime.  10/06/16   [provider]  metFORMIN (GLUCOPHAGE) 1000 MG tablet Take 1,000 mg by mouth 2 (two) times daily. 11/27/16   [provider]  montelukast (SINGULAIR) 10 MG tablet Take 10 mg by mouth at bedtime.  11/27/16   [provider]  Multiple Vitamins-Minerals (PRESERVISION AREDS) CAPS Take 1 capsule by mouth daily. 12/28/17   [provider]  Olopatadine HCl (PATADAY) 0.7 % SOLN Place 1 drop into both eyes daily.    [provider]  omeprazole (PRILOSEC) 20 MG capsule Take 1 capsule (20 mg total) by  mouth daily. Patient taking differently: Take 20 mg by mouth daily before breakfast.  01/09/17   Ambs, Norvel Richards, FNP  omeprazole (PRILOSEC) 40 MG capsule TAKE 1 CAPSULE EVERY MORNING BEFORE BREAKFAST 07/27/17   Ambs, Norvel Richards, FNP  ranitidine (ZANTAC) 150 MG tablet Take one tablet twice daily as directed 03/06/17   Ambs, Norvel Richards, FNP  ranitidine (ZANTAC) 300 MG tablet Take 1 tablet (300 mg total) by mouth at bedtime. Take one tablet daily at bedtime 07/27/17   Ambs, Norvel Richards, FNP  torsemide (DEMADEX) 20 MG tablet Take 20 mg by mouth 2 (two) times daily. 11/04/16   [provider]  TRULICITY 1.5 MG/0.5ML SOAJ Inject 1.5 mg into the skin once a week. 12/02/22   [provider]  ULORIC 40 MG tablet Take 40 mg by mouth at bedtime.  11/27/16   [provider]    Family History Family History  Problem Relation Age of Onset   Breast cancer Mother    Allergic rhinitis Mother    Hypertension Mother    Hypertension Father    Diabetes Father    Allergic rhinitis Brother     Social History Social History   Tobacco Use   Smoking status: Never   Smokeless tobacco: Never  Vaping Use   Vaping status: Never Used  Substance Use Topics   Alcohol use: No   Drug use: No     Allergies   Welchol [colesevelam hcl], Atorvastatin, Penicillins, Ace inhibitors, Diuretic [buchu-cornsilk-ch grass-hydran], Hctz [hydrochlorothiazide], Zetia [ezetimibe], and Angiotensin receptor blockers   Review of Systems Review of Systems   Physical Exam Triage Vital Signs ED Triage Vitals [05/16/23 0855]  Encounter Vitals Group     BP 114/71     Systolic BP Percentile      Diastolic BP Percentile      Pulse Rate 87     Resp 16     Temp 98 F (36.7 C)     Temp Source Oral     SpO2 98 %     Weight      Height      Head Circumference      Peak Flow      Pain Score      Pain Loc      Pain Education      Exclude from Growth Chart    No data found.  Updated Vital Signs BP 114/71 (BP  Location: Right Arm)   Pulse 87   Temp 98 F (36.7 C) (Oral)   Resp 16   SpO2 98%   Visual Acuity Right Eye Distance:   Left Eye Distance:   Bilateral Distance:    Right  Eye Near:   Left Eye Near:    Bilateral Near:     Physical Exam Constitutional:      Appearance: She is obese. She is not ill-appearing.  HENT:     Head: Normocephalic.  Eyes:     Conjunctiva/sclera: Conjunctivae normal.  Cardiovascular:     Rate and Rhythm: Normal rate and regular rhythm.     Heart sounds: Normal heart sounds.  Pulmonary:     Effort: Pulmonary effort is normal. No respiratory distress.     Breath sounds: Normal breath sounds. No wheezing or rales.  Abdominal:     Palpations: Abdomen is soft.     Tenderness: There is no abdominal tenderness. There is no guarding.  Musculoskeletal:     Thoracic back: No bony tenderness.     Lumbar back: No bony tenderness. Negative right straight leg raise test and negative left straight leg raise test.     Right hip: Normal range of motion.     Left hip: No bony tenderness. Normal range of motion.     Comments: No calf tenderness  Neurological:     Mental Status: She is alert.     Motor: No weakness.     Comments: Using a cane for assisted ambulation      UC Treatments / Results  Labs (all labs ordered are listed, but only abnormal results are displayed) Labs Reviewed - No data to display  EKG   Radiology No results found.  Procedures Procedures (including critical care time)  Medications Ordered in UC Medications - No data to display  Initial Impression / Assessment and Plan / UC Course  I have reviewed the triage vital signs and the nursing notes.  Pertinent labs & imaging results that were available during my care of the patient were reviewed by me and considered in my medical decision making (see chart for details).     63 year old female with pain left hip radiating down the leg 4 days after balancing a bag of cat food  against a railing.  Denies direct trauma or blow.  No back pain, weakness or paresthesias.  Suspect musculoskeletal strain, she has been taking Tylenol and meloxicam without relief will try a course of steroids, she was counseled to monitor her blood sugar closely and stop the steroids if her blood sugars are persistently elevated.  Recommend follow-up with PCP if no improvement in symptoms Final Clinical Impressions(s) / UC Diagnoses   Final diagnoses:  None   Discharge Instructions   None    ED Prescriptions   None    PDMP not reviewed this encounter.   Meliton Rattan, [place] 05/16/23 [phone]

## 2023-05-16 NOTE — ED Notes (Signed)
 Marylene Land PA currently in room.

## 2023-05-16 NOTE — Discharge Instructions (Addendum)
 Do not take the meloxicam while taking the prednisone.  Monitor your blood sugars closely if your blood sugars are persistently high stop the prednisone

## 2023-05-16 NOTE — ED Triage Notes (Signed)
 Pt presents with complaints of left leg pain x 5 days. Pt states her pain becomes worse when standing or walking. Pt reports she believes lifting a 32 pound bag of cat food triggered her pain. Pt currently rates her left leg pain a 9/10. Tylenol and Meloxicam 15 mg taken for pain with no relief.

## 2023-05-21 DIAGNOSIS — M25552 Pain in left hip: Secondary | ICD-10-CM | POA: Diagnosis not present

## 2023-06-05 DIAGNOSIS — H40013 Open angle with borderline findings, low risk, bilateral: Secondary | ICD-10-CM | POA: Diagnosis not present

## 2023-06-11 ENCOUNTER — Other Ambulatory Visit: Payer: Self-pay | Admitting: Physician Assistant

## 2023-06-11 DIAGNOSIS — Z1231 Encounter for screening mammogram for malignant neoplasm of breast: Secondary | ICD-10-CM

## 2023-06-26 ENCOUNTER — Ambulatory Visit
Admission: RE | Admit: 2023-06-26 | Discharge: 2023-06-26 | Disposition: A | Discharge status: Home / Self Care | Source: Ambulatory Visit | Attending: Physician Assistant | Admitting: Physician Assistant

## 2023-06-26 DIAGNOSIS — Z1231 Encounter for screening mammogram for malignant neoplasm of breast: Secondary | ICD-10-CM

## 2023-07-10 ENCOUNTER — Other Ambulatory Visit: Payer: Self-pay | Admitting: Internal Medicine

## 2023-07-10 DIAGNOSIS — R931 Abnormal findings on diagnostic imaging of heart and coronary circulation: Secondary | ICD-10-CM

## 2023-07-10 DIAGNOSIS — E785 Hyperlipidemia, unspecified: Secondary | ICD-10-CM

## 2023-07-10 DIAGNOSIS — M791 Myalgia, unspecified site: Secondary | ICD-10-CM

## 2023-07-31 DIAGNOSIS — E1169 Type 2 diabetes mellitus with other specified complication: Secondary | ICD-10-CM | POA: Diagnosis not present

## 2023-07-31 DIAGNOSIS — I10 Essential (primary) hypertension: Secondary | ICD-10-CM | POA: Diagnosis not present

## 2023-07-31 DIAGNOSIS — E7800 Pure hypercholesterolemia, unspecified: Secondary | ICD-10-CM | POA: Diagnosis not present

## 2023-07-31 DIAGNOSIS — Z7984 Long term (current) use of oral hypoglycemic drugs: Secondary | ICD-10-CM | POA: Diagnosis not present

## 2023-08-07 ENCOUNTER — Telehealth: Payer: Self-pay | Admitting: Pharmacist

## 2023-08-07 NOTE — Progress Notes (Signed)
   08/07/2023  Patient ID: Brittany Huerta, female   DOB: [birth year], 63 y.o.   MRN: [id number]  [employer] Scheduling:   Called patient to ask about scheduling for Diabetes review after A1c increased from 8.6% to 9.2% after increasing Trulicity from 4.5mg  weekly. Explained importance of diabetes control and how I can further assist with management.  Declined a second time and said she will just do what Noelle suggests with dose increase to 4.5mg  weekly and a low carb diet. Not interested in enrolling.    Kenneth Peace, PharmD [facility] Phone Number: [phone]

## 2023-09-30 ENCOUNTER — Other Ambulatory Visit (HOSPITAL_COMMUNITY): Payer: Self-pay

## 2023-09-30 ENCOUNTER — Telehealth: Payer: Self-pay | Admitting: Pharmacy Technician

## 2023-09-30 NOTE — Telephone Encounter (Signed)
 Pharmacy Patient Advocate Encounter  Received notification from [facility] that Prior Authorization for repatha  has been APPROVED from 08/31/23 to 09/29/24. Unable to obtain price due to refill too soon rejection, last fill date 09/30/23 next available fill date09/10/25   PA #/Case ID/Reference #: [id number]

## 2023-09-30 NOTE — Telephone Encounter (Signed)
 Pharmacy Patient Advocate Encounter   Received notification from CoverMyMeds that prior authorization for Repatha  is required/requested.   Insurance verification completed.   The patient is insured through [employer] .   Per test claim: PA required; PA submitted to above mentioned insurance via CoverMyMeds Key/confirmation #/EOC [facility] Status is pending

## 2023-10-05 DIAGNOSIS — Z0000 Encounter for general adult medical examination without abnormal findings: Secondary | ICD-10-CM | POA: Diagnosis not present

## 2023-10-05 DIAGNOSIS — J309 Allergic rhinitis, unspecified: Secondary | ICD-10-CM | POA: Diagnosis not present

## 2023-10-05 DIAGNOSIS — I10 Essential (primary) hypertension: Secondary | ICD-10-CM | POA: Diagnosis not present

## 2023-10-05 DIAGNOSIS — E1169 Type 2 diabetes mellitus with other specified complication: Secondary | ICD-10-CM | POA: Diagnosis not present

## 2023-10-05 DIAGNOSIS — E7800 Pure hypercholesterolemia, unspecified: Secondary | ICD-10-CM | POA: Diagnosis not present

## 2023-10-05 DIAGNOSIS — M109 Gout, unspecified: Secondary | ICD-10-CM | POA: Diagnosis not present

## 2023-11-06 DIAGNOSIS — E1169 Type 2 diabetes mellitus with other specified complication: Secondary | ICD-10-CM | POA: Diagnosis not present

## 2023-11-06 DIAGNOSIS — N1831 Chronic kidney disease, stage 3a: Secondary | ICD-10-CM | POA: Diagnosis not present

## 2023-11-06 DIAGNOSIS — H40013 Open angle with borderline findings, low risk, bilateral: Secondary | ICD-10-CM | POA: Diagnosis not present

## 2023-11-30 ENCOUNTER — Other Ambulatory Visit: Payer: Self-pay | Admitting: *Deleted

## 2023-11-30 DIAGNOSIS — E785 Hyperlipidemia, unspecified: Secondary | ICD-10-CM

## 2024-02-23 DIAGNOSIS — E7800 Pure hypercholesterolemia, unspecified: Secondary | ICD-10-CM | POA: Diagnosis not present

## 2024-02-23 DIAGNOSIS — E1169 Type 2 diabetes mellitus with other specified complication: Secondary | ICD-10-CM | POA: Diagnosis not present

## 2024-02-23 DIAGNOSIS — E785 Hyperlipidemia, unspecified: Secondary | ICD-10-CM | POA: Diagnosis not present

## 2024-02-24 LAB — NMR, LIPOPROFILE
Cholesterol, Total: 193 mg/dL (ref 100–199)
HDL Particle Number: 35.3 umol/L (ref >=30.5)
HDL-C: 46 mg/dL (ref >39)
LDL Particle Number: 1419 nmol/L — ABNORMAL HIGH (ref <1000)
LDL Size: 20.4 nm — ABNORMAL LOW (ref >20.5)
LDL-C (NIH Calc): 112 mg/dL — ABNORMAL HIGH (ref 0–99)
LP-IR Score: 90 — ABNORMAL HIGH (ref <=45)
Small LDL Particle Number: 675 nmol/L — ABNORMAL HIGH (ref <=527)
Triglycerides: 198 mg/dL — ABNORMAL HIGH (ref 0–149)

## 2024-03-01 ENCOUNTER — Ambulatory Visit: Payer: Self-pay | Admitting: Internal Medicine

## 2024-03-02 MED ORDER — REPATHA SURECLICK 140 MG/ML ~~LOC~~ SOAJ: 140.0000 mg | SUBCUTANEOUS | 1 refills | Status: AC

## 2024-04-29 ENCOUNTER — Telehealth: Payer: Self-pay | Admitting: Internal Medicine

## 2024-04-29 NOTE — Telephone Encounter (Signed)
 Pt will be in town on 05/04/24. She said she is suppose to have labs done per Dr Mona but there is no lab orders in the system. Please call pt and let her know when lab order has been put in.

## 2024-05-23 ENCOUNTER — Ambulatory Visit: Admitting: Internal Medicine
